# Patient Record
Sex: Female | Born: 1940 | Race: White | Hispanic: No | Marital: Married | Smoking: Never smoker
Health system: Southern US, Community
[De-identification: ages and names within clinical notes are randomized; demographics above are authoritative.]

## PROBLEM LIST (undated history)

## (undated) DIAGNOSIS — H269 Unspecified cataract: Secondary | ICD-10-CM

## (undated) DIAGNOSIS — C801 Malignant (primary) neoplasm, unspecified: Secondary | ICD-10-CM

## (undated) DIAGNOSIS — M810 Age-related osteoporosis without current pathological fracture: Secondary | ICD-10-CM

## (undated) HISTORY — DX: Age-related osteoporosis without current pathological fracture: M81.0

## (undated) HISTORY — PX: APPENDECTOMY: SHX54

## (undated) HISTORY — DX: Malignant (primary) neoplasm, unspecified: C80.1

## (undated) HISTORY — PX: BREAST SURGERY: SHX581

## (undated) HISTORY — DX: Unspecified cataract: H26.9

## (undated) HISTORY — PX: MASTECTOMY: SHX3

---

## 2021-08-02 DIAGNOSIS — R011 Cardiac murmur, unspecified: Secondary | ICD-10-CM | POA: Insufficient documentation

## 2021-08-02 DIAGNOSIS — Z85820 Personal history of malignant melanoma of skin: Secondary | ICD-10-CM | POA: Insufficient documentation

## 2021-08-02 DIAGNOSIS — Z85828 Personal history of other malignant neoplasm of skin: Secondary | ICD-10-CM | POA: Insufficient documentation

## 2021-08-26 ENCOUNTER — Ambulatory Visit
Admission: RE | Admit: 2021-08-26 | Discharge: 2021-08-26 | Disposition: A | Discharge status: Home / Self Care | Payer: Medicare Other | Source: Ambulatory Visit | Attending: Family Medicine | Admitting: Family Medicine

## 2021-08-26 ENCOUNTER — Other Ambulatory Visit: Payer: Self-pay | Admitting: Family Medicine

## 2021-08-26 ENCOUNTER — Other Ambulatory Visit: Payer: Self-pay

## 2021-08-26 DIAGNOSIS — S79912A Unspecified injury of left hip, initial encounter: Secondary | ICD-10-CM

## 2021-09-14 DIAGNOSIS — I493 Ventricular premature depolarization: Secondary | ICD-10-CM | POA: Insufficient documentation

## 2021-09-14 DIAGNOSIS — E785 Hyperlipidemia, unspecified: Secondary | ICD-10-CM | POA: Insufficient documentation

## 2021-09-30 ENCOUNTER — Other Ambulatory Visit: Payer: Self-pay | Admitting: Internal Medicine

## 2021-09-30 DIAGNOSIS — Z13820 Encounter for screening for osteoporosis: Secondary | ICD-10-CM

## 2021-10-18 ENCOUNTER — Other Ambulatory Visit (HOSPITAL_COMMUNITY): Payer: Self-pay | Admitting: General Surgery

## 2021-10-18 ENCOUNTER — Other Ambulatory Visit: Payer: Self-pay | Admitting: General Surgery

## 2021-10-18 DIAGNOSIS — Z853 Personal history of malignant neoplasm of breast: Secondary | ICD-10-CM | POA: Insufficient documentation

## 2021-10-18 DIAGNOSIS — C434 Malignant melanoma of scalp and neck: Secondary | ICD-10-CM

## 2021-10-19 NOTE — Progress Notes (Signed)
Surgical Instructions    Your procedure is scheduled on 10/28/21.  Report to [facility] Main Entrance "A" at 11:30 A.M., then check in with the Admitting office.  Call this number if you have problems the morning of surgery:  [phone]   If you have any questions prior to your surgery date call [phone]: Open Monday-Friday 8am-4pm    Remember:  Do not eat after midnight the night before your surgery  You may drink clear liquids until 10:30am the morning of your surgery.   Clear liquids allowed are: Water, Non-Citrus Juices (without pulp), Carbonated Beverages, Clear Tea, Black Coffee ONLY (NO MILK, CREAM OR POWDERED CREAMER of any kind), and Gatorade    Take these medicines the morning of surgery with A SIP OF WATER: NONE   As of today, STOP taking any Aspirin (unless otherwise instructed by your surgeon) Aleve, Naproxen, Ibuprofen, Motrin, Advil, Goody's, BC's, all herbal medications, fish oil, and all vitamins.  After your COVID test   You are not required to quarantine however you are required to wear a well-fitting mask when you are out and around people not in your household.  If your mask becomes wet or soiled, replace with a new one.  Wash your hands often with soap and water for 20 seconds or clean your hands with an alcohol-based hand sanitizer that contains at least 60% alcohol.  Do not share personal items.  Notify your provider: if you are in close contact with someone who has COVID  or if you develop a fever of 100.4 or greater, sneezing, cough, sore throat, shortness of breath or body aches.           Do not wear jewelry or makeup Do not wear lotions, powders, perfumes/colognes, or deodorant. Men may shave face and neck. Do not bring valuables to the hospital. Do not wear nail polish, gel polish, artificial nails, or any other type of covering on natural nails (fingers and toes) If you have artificial nails or gel coating that need to be removed by a nail  salon, please have this removed prior to surgery. Artificial nails or gel coating may interfere with anesthesia's ability to adequately monitor your vital signs.             [facility] is not responsible for any belongings or valuables.  Do NOT Smoke (Tobacco/Vaping)  24 hours prior to your procedure  If you use a CPAP at night, you may bring your mask for your overnight stay.   Contacts, glasses, hearing aids, dentures or partials may not be worn into surgery, please bring cases for these belongings   For patients admitted to the hospital, discharge time will be determined by your treatment team.   Patients discharged the day of surgery will not be allowed to drive home, and someone needs to stay with them for 24 hours.  NO VISITORS WILL BE ALLOWED IN PRE-OP WHERE PATIENTS ARE PREPPED FOR SURGERY.  ONLY 1 SUPPORT PERSON MAY BE PRESENT IN THE WAITING ROOM WHILE YOU ARE IN SURGERY.  IF YOU ARE TO BE ADMITTED, ONCE YOU ARE IN YOUR ROOM YOU WILL BE ALLOWED TWO (2) VISITORS. 1 (ONE) VISITOR MAY STAY OVERNIGHT BUT MUST ARRIVE TO THE ROOM BY 8pm.  Minor children may have two parents present. Special consideration for safety and communication needs will be reviewed on a case by case basis.  Special instructions:    Oral Hygiene is also important to reduce your risk of infection.  Remember -  BRUSH YOUR TEETH THE MORNING OF SURGERY WITH YOUR REGULAR TOOTHPASTE   [place]- Preparing For Surgery  Before surgery, you can play an important role. Because skin is not sterile, your skin needs to be as free of germs as possible. You can reduce the number of germs on your skin by washing with CHG (chlorahexidine gluconate) Soap before surgery.  CHG is an antiseptic cleaner which kills germs and bonds with the skin to continue killing germs even after washing.     Please do not use if you have an allergy to CHG or antibacterial soaps. If your skin becomes reddened/irritated stop using the CHG.  Do not  shave (including legs and underarms) for at least 48 hours prior to first CHG shower. It is OK to shave your face.  Please follow these instructions carefully.     Shower the NIGHT BEFORE SURGERY and the MORNING OF SURGERY with CHG Soap.   If you chose to wash your hair, wash your hair first as usual with your normal shampoo. After you shampoo, rinse your hair and body thoroughly to remove the shampoo.  Then [employer] and genitals (private parts) with your normal soap and rinse thoroughly to remove soap.  After that Use CHG Soap as you would any other liquid soap. You can apply CHG directly to the skin and wash gently with a scrungie or a clean washcloth.   Apply the CHG Soap to your body ONLY FROM THE NECK DOWN.  Do not use on open wounds or open sores. Avoid contact with your eyes, ears, mouth and genitals (private parts). Wash Face and genitals (private parts)  with your normal soap.   Wash thoroughly, paying special attention to the area where your surgery will be performed.  Thoroughly rinse your body with warm water from the neck down.  DO NOT shower/wash with your normal soap after using and rinsing off the CHG Soap.  Pat yourself dry with a CLEAN TOWEL.  Wear CLEAN PAJAMAS to bed the night before surgery  Place CLEAN SHEETS on your bed the night before your surgery  DO NOT SLEEP WITH PETS.   Day of Surgery: Take a shower with CHG soap. Wear Clean/Comfortable clothing the morning of surgery Do not apply any deodorants/lotions.   Remember to brush your teeth WITH YOUR REGULAR TOOTHPASTE.   Please read over the following fact sheets that you were given.

## 2021-10-20 ENCOUNTER — Encounter (HOSPITAL_COMMUNITY): Payer: Self-pay

## 2021-10-20 ENCOUNTER — Encounter (HOSPITAL_COMMUNITY)
Admission: RE | Admit: 2021-10-20 | Discharge: 2021-10-20 | Disposition: A | Discharge status: Home / Self Care | Payer: Medicare Other | Source: Ambulatory Visit | Attending: General Surgery | Admitting: General Surgery

## 2021-10-20 ENCOUNTER — Ambulatory Visit (HOSPITAL_COMMUNITY)
Admission: RE | Admit: 2021-10-20 | Discharge: 2021-10-20 | Disposition: A | Discharge status: Home / Self Care | Payer: Medicare Other | Source: Ambulatory Visit | Attending: General Surgery | Admitting: General Surgery

## 2021-10-20 ENCOUNTER — Other Ambulatory Visit: Payer: Self-pay

## 2021-10-20 DIAGNOSIS — C434 Malignant melanoma of scalp and neck: Secondary | ICD-10-CM | POA: Insufficient documentation

## 2021-10-20 LAB — CBC WITH DIFFERENTIAL/PLATELET
Abs Immature Granulocytes: 0.02 10*3/uL (ref 0.00–0.07)
Basophils Absolute: 0 10*3/uL (ref 0.0–0.1)
Basophils Relative: 1 %
Eosinophils Absolute: 0.1 10*3/uL (ref 0.0–0.5)
Eosinophils Relative: 1 %
HCT: 41.9 % (ref 36.0–46.0)
Hemoglobin: 14.1 g/dL (ref 12.0–15.0)
Immature Granulocytes: 0 %
Lymphocytes Relative: 25 %
Lymphs Abs: 1.6 10*3/uL (ref 0.7–4.0)
MCH: 32.7 pg (ref 26.0–34.0)
MCHC: 33.7 g/dL (ref 30.0–36.0)
MCV: 97.2 fL (ref 80.0–100.0)
Monocytes Absolute: 0.5 10*3/uL (ref 0.1–1.0)
Monocytes Relative: 8 %
Neutro Abs: 4.3 10*3/uL (ref 1.7–7.7)
Neutrophils Relative %: 65 %
Platelets: 275 10*3/uL (ref 150–400)
RBC: 4.31 MIL/uL (ref 3.87–5.11)
RDW: 12.3 % (ref 11.5–15.5)
WBC: 6.6 10*3/uL (ref 4.0–10.5)
nRBC: 0 % (ref 0.0–0.2)

## 2021-10-20 LAB — COMPREHENSIVE METABOLIC PANEL
ALT: 14 U/L (ref 0–44)
AST: 21 U/L (ref 15–41)
Albumin: 3.9 g/dL (ref 3.5–5.0)
Alkaline Phosphatase: 125 U/L (ref 38–126)
Anion gap: 8 (ref 5–15)
BUN: 8 mg/dL (ref 8–23)
CO2: 25 mmol/L (ref 22–32)
Calcium: 10 mg/dL (ref 8.9–10.3)
Chloride: 101 mmol/L (ref 98–111)
Creatinine, Ser: 0.61 mg/dL (ref 0.44–1.00)
GFR, Estimated: >60 mL/min (ref >60)
Glucose, Bld: 102 mg/dL — ABNORMAL HIGH (ref 70–99)
Potassium: 4.1 mmol/L (ref 3.5–5.1)
Sodium: 134 mmol/L — ABNORMAL LOW (ref 135–145)
Total Bilirubin: 0.7 mg/dL (ref 0.3–1.2)
Total Protein: 6.8 g/dL (ref 6.5–8.1)

## 2021-10-20 LAB — LACTATE DEHYDROGENASE: LDH: 151 U/L (ref 98–192)

## 2021-10-20 NOTE — Progress Notes (Signed)
PCP - Dr. Michae Kava, [facility] Cardiologist - none  PPM/ICD - denies Device Orders -  Rep Notified -   Chest x-ray - 10/20/21 EKG - no Stress Test - no ECHO - no Cardiac Cath - no Sleep Study - none CPAP -   Fasting Blood Sugar - n/a Checks Blood Sugar _____ times a day  Blood Thinner Instructions:n/a Aspirin Instructions:n/a  ERAS Protcol -yes,clear liquids until 1030 PRE-SURGERY Ensure or G2- no  COVID TEST- n/a;ambulatory surgery   Anesthesia review: no  Patient denies shortness of breath, fever, cough and chest pain at PAT appointment   All instructions explained to the patient, with a verbal understanding of the material. Patient agrees to go over the instructions while at home for a better understanding. Patient also instructed to self quarantine after being tested for COVID-19. The opportunity to ask questions was provided.

## 2021-10-22 ENCOUNTER — Ambulatory Visit (HOSPITAL_COMMUNITY)
Admission: RE | Admit: 2021-10-22 | Discharge: 2021-10-22 | Disposition: A | Discharge status: Home / Self Care | Payer: Medicare Other | Source: Ambulatory Visit | Attending: General Surgery | Admitting: General Surgery

## 2021-10-22 ENCOUNTER — Other Ambulatory Visit: Payer: Self-pay

## 2021-10-22 DIAGNOSIS — C434 Malignant melanoma of scalp and neck: Secondary | ICD-10-CM | POA: Insufficient documentation

## 2021-10-22 MED ORDER — TECHNETIUM TC 99M TILMANOCEPT KIT
0.5000 | PACK | Freq: Once | INTRAVENOUS | Status: AC | PRN
  Administered 2021-10-22: 0.5 via INTRADERMAL

## 2021-10-27 NOTE — H&P (Signed)
REFERRING: Ernestine Mcmurray, PA  PROVIDER: Georgianne Fick, MD  MRN: [id number] DOB: [birth year] DATE OF ENCOUNTER: 10/18/2021 Subjective   Chief Complaint: Melanoma   History of Present Illness: Christina Mcguire is a 81 y.o. 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 The patient is accompanied by her daughter. The patient is not sure how long she has had a mass on her scalp, but her daughter states it has been a while. However, it used to just be the center portion. The newer lateral darker aspect has only been present for a few months. She was seen for a routine Derm visit for a skin check and was told to have this mass on her scalp. Several biopsies were performed. One of them showed melanoma in situ, but the other one showed invasive melanoma that was 3.8 mm in thickness with positive lateral margin. There was lymphovascular invasion seen. Ulceration and regression was not seen. There was no neurotropism. The mitotic rate was 13/mm. There were nonbrisk tumor infiltrating lymphocytes.  Of note, the patient has had melanoma twice in the past. She had a 5 melanoma in 1982 and a right arm melanoma in 2006. She had left breast cancer in 1989 that was treated with a mastectomy. That was at age 63. Her daughter has also had breast cancer. Her daughter had negative BRCA testing in approximately 2014 or 15.  Path is from [place] [facility] out of [place]  Accession number [id number] Patient ID is [id number], date was 10/05/21  Review of Systems: A complete review of systems was obtained from the patient. I have reviewed this information and discussed as appropriate with the patient. See HPI as well for other ROS.  Review of Systems  All other systems reviewed and are negative.   Medical History: Past Medical History:   Diagnosis Date   History of cancer   Patient Active Problem List  Diagnosis   Malignant melanoma of scalp (CMS-HCC)   H/O malignant neoplasm of breast   History of melanoma   Past Surgical History:  Procedure Laterality Date   APPENDECTOMY   MASTECTOMY    No Known Allergies  No current outpatient medications on file prior to visit.   No current facility-administered medications on file prior to visit.   No family history on file.   Social History   Tobacco Use  Smoking Status Never  Smokeless Tobacco Never    Social History   Socioeconomic History   Marital status: Married  Tobacco Use   Smoking status: Never   Smokeless tobacco: Never  Substance and Sexual Activity   Alcohol use: Yes   Drug use: Never   Objective:   Vitals:  10/18/21 0953  BP: 130/82  Pulse: 77  Temp: 37 C (98.6 F)  SpO2: 98%  Weight: 64.3 kg (141 lb 12.8 oz)  Height: 152.4 cm (5')   Body mass index is 27.69 kg/m.  Head: Normocephalic and atraumatic.  Mouth/Throat: Oropharynx is clear and moist. No oropharyngeal exudate.  Eyes: Conjunctivae are normal. Pupils are equal, round, and reactive to light. No scleral icterus.  Neck: Normal range of motion. Neck supple. No tracheal deviation present. No thyromegaly present.  Cardiovascular: Normal rate, regular rhythm, normal heart sounds and intact distal pulses. Exam reveals no gallop and no friction rub.  No murmur heard.  Respiratory: Effort normal and breath sounds normal. No respiratory distress. No wheezes, rales or rhonchi. No chest wall tenderness.  GI: Soft. Bowel sounds are normal. Abdomen is soft, non tender, non distended. No masses or hepatosplenomegaly is present. There is no rebound and no guarding.  Musculoskeletal: . Extremities are non tender and without deformity.  Lymphadenopathy: No cervical or axillary adenopathy.  Neurological: Alert and oriented to person, place, and time. Coordination normal.  Skin: Scalp with  lesion on vertex just to the right. There is a central portion that is purplish/brown with surrounding scab and black areas. The center portion is around 8-9 mm and the total diameter is around 1.8-2 cm.  Skin is warm and dry. No rash noted. No diaphoresis. No erythema. No pallor.  Psychiatric: Normal mood and affect.Behavior is normal. Judgment and thought content normal.   Labs, Imaging and Diagnostic Testing:  Assessment and Plan:   Diagnoses and all orders for this visit:  Malignant melanoma of scalp (CMS-HCC) - NM lymphoscintigraphy  H/O malignant neoplasm of breast  History of melanoma   Patient has a new diagnosis of at least pT3a scalp melanoma. I recommend a wide local excision, dermal matrix closure, and sentinel node biopsy. She will need preoperative lymphoscintigraphy.  I discussed the procedure with the patient and her daughter. I reviewed that we would need margins and the approximate size of the defect. I discussed that these are will not grow back in this region. I reviewed the risk of numbness and bleeding. I discussed that the wound will likely take quite a while to heal.  For the sentinel node biopsy, I do recommend this given the size, lymphovascular invasion, and mitotic rate. Immunotherapy is still well-tolerated by 86 year olds, and she is relatively active with activities at [facility] [facility]   I discussed risks of sentinel node biopsy including bleeding, infection, fluid buildup, numbness, motor nerve damage, and more. I discussed that it is possible that the sentinel node could be in the parotid gland. The patient and her daughter are reluctant to have a sentinel node biopsy at a location that is risky given her age. That is reasonable. If the lymphoscintigram mapped to the parotid, we would not do a biopsy in this region.  I discussed referral to genetics given her history of 3 melanomas and breast cancer as well as her daughter's history of breast cancer. Her  daughter just had a breast cancer panel, a larger scale panel would be indicated for the patient. They are going to think about this. The daughter has significant travel plans coming up starting at the end of February. These are work-related. We will do this at the first available opportunity.    Georgianne Fick, MD

## 2021-10-28 ENCOUNTER — Other Ambulatory Visit: Payer: Self-pay

## 2021-10-28 ENCOUNTER — Ambulatory Visit (HOSPITAL_COMMUNITY): Payer: Medicare Other | Admitting: Anesthesiology

## 2021-10-28 ENCOUNTER — Other Ambulatory Visit: Payer: Self-pay | Admitting: General Surgery

## 2021-10-28 ENCOUNTER — Ambulatory Visit (HOSPITAL_BASED_OUTPATIENT_CLINIC_OR_DEPARTMENT_OTHER): Payer: Medicare Other | Admitting: Anesthesiology

## 2021-10-28 ENCOUNTER — Encounter (HOSPITAL_COMMUNITY)
Admission: RE | Disposition: A | Discharge status: Home / Self Care | Payer: Self-pay | Source: Home / Self Care | Attending: General Surgery

## 2021-10-28 ENCOUNTER — Ambulatory Visit (HOSPITAL_COMMUNITY)
Admission: RE | Admit: 2021-10-28 | Discharge: 2021-10-28 | Disposition: A | Discharge status: Home / Self Care | Payer: Medicare Other | Attending: General Surgery | Admitting: General Surgery

## 2021-10-28 ENCOUNTER — Encounter (HOSPITAL_COMMUNITY): Payer: Self-pay | Admitting: General Surgery

## 2021-10-28 DIAGNOSIS — Z853 Personal history of malignant neoplasm of breast: Secondary | ICD-10-CM | POA: Diagnosis not present

## 2021-10-28 DIAGNOSIS — C434 Malignant melanoma of scalp and neck: Secondary | ICD-10-CM | POA: Insufficient documentation

## 2021-10-28 DIAGNOSIS — Z85820 Personal history of malignant melanoma of skin: Secondary | ICD-10-CM | POA: Diagnosis not present

## 2021-10-28 HISTORY — PX: MELANOMA EXCISION WITH SENTINEL LYMPH NODE BIOPSY: SHX5267

## 2021-10-28 SURGERY — MELANOMA EXCISION WITH SENTINEL LYMPH NODE BIOPSY
Anesthesia: General | Site: Scalp

## 2021-10-28 MED ORDER — LIDOCAINE HCL (PF) 1 % IJ SOLN
INTRAMUSCULAR | Status: AC
  Filled 2021-10-28: qty 30

## 2021-10-28 MED ORDER — CHLORHEXIDINE GLUCONATE CLOTH 2 % EX PADS: 6.0000 | MEDICATED_PAD | Freq: Once | CUTANEOUS | Status: DC

## 2021-10-28 MED ORDER — CEFAZOLIN SODIUM-DEXTROSE 2-4 GM/100ML-% IV SOLN
INTRAVENOUS | Status: AC
  Filled 2021-10-28: qty 100

## 2021-10-28 MED ORDER — PHENYLEPHRINE HCL-NACL 20-0.9 MG/250ML-% IV SOLN
INTRAVENOUS | Status: DC | PRN
  Administered 2021-10-28: 50 ug/min via INTRAVENOUS

## 2021-10-28 MED ORDER — ROCURONIUM BROMIDE 10 MG/ML (PF) SYRINGE
PREFILLED_SYRINGE | INTRAVENOUS | Status: DC | PRN
Start: 2021-10-28 — End: 2021-10-28
  Administered 2021-10-28: 70 mg via INTRAVENOUS

## 2021-10-28 MED ORDER — FENTANYL CITRATE (PF) 250 MCG/5ML IJ SOLN
INTRAMUSCULAR | Status: AC
  Filled 2021-10-28: qty 5

## 2021-10-28 MED ORDER — PHENYLEPHRINE 40 MCG/ML (10ML) SYRINGE FOR IV PUSH (FOR BLOOD PRESSURE SUPPORT)
PREFILLED_SYRINGE | INTRAVENOUS | Status: DC | PRN
  Administered 2021-10-28: 40 ug via INTRAVENOUS
  Administered 2021-10-28: 80 ug via INTRAVENOUS

## 2021-10-28 MED ORDER — 0.9 % SODIUM CHLORIDE (POUR BTL) OPTIME
TOPICAL | Status: DC | PRN
Start: 2021-10-28 — End: 2021-10-28
  Administered 2021-10-28: 1000 mL

## 2021-10-28 MED ORDER — OXYCODONE HCL 5 MG/5ML PO SOLN: 5.0000 mg | Freq: Once | ORAL | Status: DC | PRN

## 2021-10-28 MED ORDER — CEFAZOLIN SODIUM-DEXTROSE 2-4 GM/100ML-% IV SOLN
2.0000 g | INTRAVENOUS | Status: AC
  Administered 2021-10-28: 25 g via INTRAVENOUS
  Administered 2021-10-28: 2 g via INTRAVENOUS

## 2021-10-28 MED ORDER — PHENYLEPHRINE HCL (PRESSORS) 10 MG/ML IV SOLN
INTRAVENOUS | Status: AC
  Filled 2021-10-28: qty 1

## 2021-10-28 MED ORDER — FENTANYL CITRATE (PF) 100 MCG/2ML IJ SOLN
INTRAMUSCULAR | Status: DC | PRN
  Administered 2021-10-28: 100 ug via INTRAVENOUS

## 2021-10-28 MED ORDER — CHLORHEXIDINE GLUCONATE 0.12 % MT SOLN
15.0000 mL | Freq: Once | OROMUCOSAL | Status: AC
  Administered 2021-10-28: 15 mL via OROMUCOSAL
  Filled 2021-10-28: qty 15

## 2021-10-28 MED ORDER — EPHEDRINE SULFATE-NACL 50-0.9 MG/10ML-% IV SOSY
PREFILLED_SYRINGE | INTRAVENOUS | Status: DC | PRN
Start: 2021-10-28 — End: 2021-10-28
  Administered 2021-10-28: 5 mg via INTRAVENOUS

## 2021-10-28 MED ORDER — OXYCODONE HCL 5 MG PO TABS: 2.5000 mg | ORAL_TABLET | Freq: Four times a day (QID) | ORAL | 0 refills | Status: DC | PRN

## 2021-10-28 MED ORDER — ONDANSETRON HCL 4 MG/2ML IJ SOLN
INTRAMUSCULAR | Status: DC | PRN
Start: 2021-10-28 — End: 2021-10-28
  Administered 2021-10-28: 4 mg via INTRAVENOUS

## 2021-10-28 MED ORDER — AMISULPRIDE (ANTIEMETIC) 5 MG/2ML IV SOLN: 10.0000 mg | Freq: Once | INTRAVENOUS | Status: DC | PRN

## 2021-10-28 MED ORDER — LACTATED RINGERS IV SOLN: INTRAVENOUS | Status: DC

## 2021-10-28 MED ORDER — ACETAMINOPHEN 500 MG PO TABS: 1000.0000 mg | ORAL_TABLET | ORAL | Status: AC

## 2021-10-28 MED ORDER — SUGAMMADEX SODIUM 200 MG/2ML IV SOLN
INTRAVENOUS | Status: DC | PRN
Start: 2021-10-28 — End: 2021-10-28
  Administered 2021-10-28: 200 mg via INTRAVENOUS

## 2021-10-28 MED ORDER — BUPIVACAINE-EPINEPHRINE (PF) 0.25% -1:200000 IJ SOLN
INTRAMUSCULAR | Status: AC
  Filled 2021-10-28: qty 30

## 2021-10-28 MED ORDER — PROMETHAZINE HCL 25 MG/ML IJ SOLN: 6.2500 mg | INTRAMUSCULAR | Status: DC | PRN

## 2021-10-28 MED ORDER — OXYCODONE HCL 5 MG PO TABS: 5.0000 mg | ORAL_TABLET | Freq: Once | ORAL | Status: DC | PRN

## 2021-10-28 MED ORDER — PROPOFOL 10 MG/ML IV BOLUS
INTRAVENOUS | Status: DC | PRN
  Administered 2021-10-28: 170 mg via INTRAVENOUS

## 2021-10-28 MED ORDER — ACETAMINOPHEN 500 MG PO TABS
ORAL_TABLET | ORAL | Status: AC
  Administered 2021-10-28: 1000 mg via ORAL
  Filled 2021-10-28: qty 2

## 2021-10-28 MED ORDER — LIDOCAINE HCL 1 % IJ SOLN
INTRAMUSCULAR | Status: DC | PRN
  Administered 2021-10-28: 6 mL via INTRAMUSCULAR

## 2021-10-28 MED ORDER — ORAL CARE MOUTH RINSE: 15.0000 mL | Freq: Once | OROMUCOSAL | Status: AC

## 2021-10-28 MED ORDER — HYDROMORPHONE HCL 1 MG/ML IJ SOLN: 0.2500 mg | INTRAMUSCULAR | Status: DC | PRN

## 2021-10-28 MED ORDER — PROPOFOL 10 MG/ML IV BOLUS
INTRAVENOUS | Status: AC
  Filled 2021-10-28: qty 20

## 2021-10-28 MED ORDER — LIDOCAINE 2% (20 MG/ML) 5 ML SYRINGE
INTRAMUSCULAR | Status: DC | PRN
  Administered 2021-10-28: 60 mg via INTRAVENOUS

## 2021-10-28 SURGICAL SUPPLY — 46 items
BLADE SURG 10 STRL SS (BLADE) ×2 IMPLANT
BNDG GAUZE ELAST 4 BULKY (GAUZE/BANDAGES/DRESSINGS) ×2 IMPLANT
CANISTER SUCT 3000ML PPV (MISCELLANEOUS) ×2 IMPLANT
COVER PROBE W GEL 5X96 (DRAPES) ×1 IMPLANT
COVER SURGICAL LIGHT HANDLE (MISCELLANEOUS) ×2 IMPLANT
DECANTER SPIKE VIAL GLASS SM (MISCELLANEOUS) ×2 IMPLANT
DRAPE LAPAROSCOPIC ABDOMINAL (DRAPES) ×1 IMPLANT
DRAPE ORTHO SPLIT 87X125 STRL (DRAPES) ×1 IMPLANT
DRSG ADAPTIC 3X8 NADH LF (GAUZE/BANDAGES/DRESSINGS) ×1 IMPLANT
DRSG PAD ABDOMINAL 8X10 ST (GAUZE/BANDAGES/DRESSINGS) ×1 IMPLANT
DRSG TEGADERM 4X4.75 (GAUZE/BANDAGES/DRESSINGS) ×4 IMPLANT
ELECT REM PT RETURN 9FT ADLT (ELECTROSURGICAL) ×2
ELECTRODE REM PT RTRN 9FT ADLT (ELECTROSURGICAL) ×1 IMPLANT
GAUZE SPONGE 2X2 8PLY STRL LF (GAUZE/BANDAGES/DRESSINGS) ×1 IMPLANT
GAUZE SPONGE 4X4 12PLY STRL (GAUZE/BANDAGES/DRESSINGS) ×1 IMPLANT
GLOVE SURG ENC MOIS LTX SZ6 (GLOVE) ×2 IMPLANT
GLOVE SURG UNDER LTX SZ6.5 (GLOVE) ×2 IMPLANT
GOWN STRL REUS W/ TWL LRG LVL3 (GOWN DISPOSABLE) ×2 IMPLANT
GOWN STRL REUS W/TWL 2XL LVL3 (GOWN DISPOSABLE) ×4 IMPLANT
GOWN STRL REUS W/TWL LRG LVL3 (GOWN DISPOSABLE) ×2
GRAFT MYRIAD 5 LAYER 7X10 (Graft) ×1 IMPLANT
KIT BASIN OR (CUSTOM PROCEDURE TRAY) ×2 IMPLANT
KIT TURNOVER KIT B (KITS) ×2 IMPLANT
MARKER SKIN DUAL TIP RULER LAB (MISCELLANEOUS) ×2 IMPLANT
NDL 18GX1X1/2 (RX/OR ONLY) (NEEDLE) ×1 IMPLANT
NDL HYPO 25GX1X1/2 BEV (NEEDLE) ×1 IMPLANT
NEEDLE 18GX1X1/2 (RX/OR ONLY) (NEEDLE) ×2 IMPLANT
NEEDLE 22X1 1/2 (OR ONLY) (NEEDLE) ×2 IMPLANT
NEEDLE HYPO 25GX1X1/2 BEV (NEEDLE) ×2 IMPLANT
NS IRRIG 1000ML POUR BTL (IV SOLUTION) ×2 IMPLANT
PACK GENERAL/GYN (CUSTOM PROCEDURE TRAY) ×2 IMPLANT
PACK UNIVERSAL I (CUSTOM PROCEDURE TRAY) ×1 IMPLANT
PAD ARMBOARD 7.5X6 YLW CONV (MISCELLANEOUS) ×5 IMPLANT
POWDER MYRIAD MORCELLS 500MG (Miscellaneous) ×1 IMPLANT
SPECIMEN JAR MEDIUM (MISCELLANEOUS) ×2 IMPLANT
SPONGE GAUZE 2X2 STER 10/PKG (GAUZE/BANDAGES/DRESSINGS) ×1
STOCKINETTE 6  STRL (DRAPES) ×2
STOCKINETTE 6 STRL (DRAPES) IMPLANT
STOCKINETTE IMPERVIOUS 9X36 MD (GAUZE/BANDAGES/DRESSINGS) ×2 IMPLANT
STRIP CLOSURE SKIN 1/2X4 (GAUZE/BANDAGES/DRESSINGS) ×2 IMPLANT
SUT MNCRL AB 4-0 PS2 18 (SUTURE) ×3 IMPLANT
SUT SILK 2 0 PERMA HAND 18 BK (SUTURE) ×2 IMPLANT
SUT VIC AB 3-0 SH 8-18 (SUTURE) ×2 IMPLANT
SYR CONTROL 10ML LL (SYRINGE) ×4 IMPLANT
TOWEL GREEN STERILE (TOWEL DISPOSABLE) ×2 IMPLANT
TOWEL GREEN STERILE FF (TOWEL DISPOSABLE) ×2 IMPLANT

## 2021-10-28 NOTE — Anesthesia Procedure Notes (Signed)
Procedure Name: Intubation Date/Time: 10/28/2021 3:04 PM Performed by: Georgia Duff, CRNA Pre-anesthesia Checklist: Patient identified, Emergency Drugs available, Suction available and Patient being monitored Patient Re-evaluated:Patient Re-evaluated prior to induction Oxygen Delivery Method: Circle System Utilized Preoxygenation: Pre-oxygenation with 100% oxygen Induction Type: IV induction Ventilation: Mask ventilation without difficulty Laryngoscope Size: Mac and 3 Grade View: Grade I Tube type: Oral Tube size: 7.0 mm Number of attempts: 1 Airway Equipment and Method: Stylet and Oral airway Placement Confirmation: ETT inserted through vocal cords under direct vision, positive ETCO2 and breath sounds checked- equal and bilateral Secured at: 21 cm Tube secured with: Tape Dental Injury: Teeth and Oropharynx as per pre-operative assessment

## 2021-10-28 NOTE — Transfer of Care (Signed)
Immediate Anesthesia Transfer of Care Note  Patient: Christina Mcguire  Procedure(s) Performed: WIDE LOCAL EXCISION SCALP MELANOMA WITH SENTINEL LYMPH NODE BIOPSY, COVERAGE WITH DERMAL MATRIX (Scalp)  Patient Location: PACU  Anesthesia Type:General  Level of Consciousness: awake and alert   Airway & Oxygen Therapy: Patient Spontanous Breathing  Post-op Assessment: Report given to RN and Post -op Vital signs reviewed and stable  Post vital signs: Reviewed and stable  Last Vitals:  Vitals Value Taken Time  BP 144/68 10/28/21 1628  Temp 36.5 C 10/28/21 1628  Pulse 61 10/28/21 1635  Resp 12 10/28/21 1635  SpO2 98 % 10/28/21 1635  Vitals shown include unvalidated device data.  Last Pain:  Vitals:   10/28/21 1628  TempSrc:   PainSc: 0-No pain      Patients Stated Pain Goal: 0 (11/00/34 9611)  Complications: No notable events documented.

## 2021-10-28 NOTE — Op Note (Signed)
PRE-OPERATIVE DIAGNOSIS: at least pT3a cN0 scalp melanoma   POST-OPERATIVE DIAGNOSIS:  Same  PROCEDURE:  Procedure(s): Wide local excision 1 cm margins, dermal matrix coverage for defect 3.4 cm x 4.5 cm,   SURGEON:  Surgeon(s): Stark Klein, MD  ASSIST:  Ewell Poe, RNFA  ANESTHESIA:   local and general  DRAINS: none   LOCAL MEDICATIONS USED:  MARCAINE    and XYLOCAINE   SPECIMEN:  Source of Specimen:  wide local excision scalp melanoma  FINDINGS:  margins grossly negative.    DISPOSITION OF SPECIMEN:  PATHOLOGY  COUNTS:  YES  PLAN OF CARE: Discharge to home after PACU  PATIENT DISPOSITION:  PACU - hemodynamically stable.    PROCEDURE:   Pt was identified in the holding area, taken to the OR, and placed supine on the OR table.  General anesthesia was induced.  Time out was performed according to the surgical safety checklist.  When all was correct, we continued.  The patient was placed into the head elevated position with the head rotated to the left.  The scalp and posterior neck were prepped and draped in sterile fashion.  .    The melanoma was identified and 1 cm margins were marked out.  Local was administered under the melanoma and the adjacent tissue.  A #10 blade was used to incise the skin around the melanoma.  The cautery was used to take the dissection down to the fascia.  The skin was marked in situ with orientation sutures.  The cautery was used to take the specimen off the fascia, and it was passed off the table.    The myriad matrix was selected.  This was secured with interrupted vicryl sutures.  Myriad morcell powder was placed underneath.  This was covered with adaptic.  The adaptic was also secured with separate interrupted sutures outside the circumference of the incision and dermal matrix graft.    The melanoma site was cleaned, dried, and dressed with surgilube, gauze, ABD, and stockingette.   Needle, sponge, and instrument counts were correct.  The  patient was awakened from anesthesia and taken to the PACU in stable condition.

## 2021-10-28 NOTE — Interval H&P Note (Signed)
History and Physical Interval Note:  10/28/2021 2:08 PM  Christina Mcguire  has presented today for surgery, with the diagnosis of MALIGNANT MELANOMA OF SCALP.  The various methods of treatment have been discussed with the patient and family. After consideration of risks, benefits and other options for treatment, the patient has consented to  Procedure(s): WIDE LOCAL EXCISION SCALP MELANOMA WITH SENTINEL LYMPH NODE BIOPSY, COVERAGE WITH DERMAL MATRIX (N/A) as a surgical intervention.  The patient's history has been reviewed, patient examined, no change in status, stable for surgery.  I have reviewed the patient's chart and labs.  Questions were answered to the patient's satisfaction.     Stark Klein

## 2021-10-28 NOTE — Anesthesia Preprocedure Evaluation (Signed)
Anesthesia Evaluation  Patient identified by MRN, date of birth, ID band Patient awake    Reviewed: Allergy & Precautions, NPO status , Patient's Chart, lab work & pertinent test results  Airway Mallampati: II  TM Distance: >3 FB Neck ROM: Full    Dental no notable dental hx.    Pulmonary neg pulmonary ROS,    Pulmonary exam normal breath sounds clear to auscultation       Cardiovascular negative cardio ROS Normal cardiovascular exam Rhythm:Regular Rate:Normal     Neuro/Psych negative neurological ROS  negative psych ROS   GI/Hepatic negative GI ROS, Neg liver ROS,   Endo/Other  negative endocrine ROS  Renal/GU negative Renal ROS  negative genitourinary   Musculoskeletal negative musculoskeletal ROS (+)   Abdominal   Peds negative pediatric ROS (+)  Hematology negative hematology ROS (+)   Anesthesia Other Findings Malignant Melanoma  Reproductive/Obstetrics negative OB ROS                             Anesthesia Physical Anesthesia Plan  ASA: 3  Anesthesia Plan: General   Post-op Pain Management:    Induction: Intravenous  PONV Risk Score and Plan: 3 and Ondansetron, Dexamethasone, Midazolam and Treatment may vary due to age or medical condition  Airway Management Planned: Oral ETT  Additional Equipment:   Intra-op Plan:   Post-operative Plan: Extubation in OR  Informed Consent: I have reviewed the patients History and Physical, chart, labs and discussed the procedure including the risks, benefits and alternatives for the proposed anesthesia with the patient or authorized representative who has indicated his/her understanding and acceptance.     Dental advisory given  Plan Discussed with: CRNA  Anesthesia Plan Comments:         Anesthesia Quick Evaluation

## 2021-10-28 NOTE — Discharge Instructions (Addendum)
Change dressing once daily.  Remove the gauze portion and apply surgilube.  Recover with gauze.  Keep moist with the surgilube.  Do not shower without shower cap until cleared by me.      [place] Office Phone Number [phone]   POST OP INSTRUCTIONS  Always review your discharge instruction sheet given to you by the facility where your surgery was performed.  IF YOU HAVE DISABILITY OR FAMILY LEAVE FORMS, YOU MUST BRING THEM TO THE OFFICE FOR PROCESSING.  DO NOT GIVE THEM TO YOUR DOCTOR.  A prescription for pain medication may be given to you upon discharge.  Take your pain medication as prescribed, if needed.  If narcotic pain medicine is not needed, then you may take acetaminophen (Tylenol) or ibuprofen (Advil) as needed. Take your usually prescribed medications unless otherwise directed If you need a refill on your pain medication, please contact your pharmacy.  They will contact our office to request authorization.  Prescriptions will not be filled after 5pm or on week-ends. You should eat very light the first 24 hours after surgery, such as soup, crackers, pudding, etc.  Resume your normal diet the day after surgery It is common to experience some constipation if taking pain medication after surgery.  Increasing fluid intake and taking a stool softener will usually help or prevent this problem from occurring.  A mild laxative (Milk of Magnesia or Miralax) should be taken according to package directions if there are no bowel movements after 48 hours. You may shower in 48 hours with shower cap.  The surgical glue will flake off in 2-3 weeks.   ACTIVITIES:  No strenuous activity or heavy lifting for 1 week.   You may drive when you no longer are taking prescription pain medication, you can comfortably wear a seatbelt, and you can safely maneuver your car and apply brakes. RETURN TO WORK:  __________n/a_______________ Dennis Bast should see your doctor in the office for a  follow-up appointment approximately three-four weeks after your surgery.    WHEN TO CALL YOUR DOCTOR: Fever over 101.0 Nausea and/or vomiting. Extreme swelling or bruising. Continued bleeding from incision. Increased pain, redness, or drainage from the incision.  The clinic staff is available to answer your questions during regular business hours.  Please dont hesitate to call and ask to speak to one of the nurses for clinical concerns.  If you have a medical emergency, go to the nearest emergency room or call 911.  A surgeon from [facility] is always on call at the hospital.  For further questions, please visit [email or web address]

## 2021-10-28 NOTE — Anesthesia Postprocedure Evaluation (Signed)
Anesthesia Post Note  Patient: Christina Mcguire  Procedure(s) Performed: WIDE LOCAL EXCISION SCALP MELANOMA COVERAGE WITH DERMAL MATRIX (Scalp)     Patient location during evaluation: PACU Anesthesia Type: General Level of consciousness: awake and alert, oriented and patient cooperative Pain management: pain level controlled Vital Signs Assessment: post-procedure vital signs reviewed and stable Respiratory status: spontaneous breathing, nonlabored ventilation and respiratory function stable Cardiovascular status: blood pressure returned to baseline and stable Postop Assessment: no apparent nausea or vomiting Anesthetic complications: no   No notable events documented.  Last Vitals:  Vitals:   10/28/21 1643 10/28/21 1658  BP: 139/71 (!) 145/61  Pulse: (!) 56 (!) 56  Resp: 12 14  Temp:  36.6 C  SpO2: 98% 99%    Last Pain:  Vitals:   10/28/21 1658  TempSrc:   PainSc: 0-No pain                 Pervis Hocking

## 2021-10-29 ENCOUNTER — Encounter (HOSPITAL_COMMUNITY): Payer: Self-pay | Admitting: General Surgery

## 2021-11-03 LAB — SURGICAL PATHOLOGY

## 2021-11-18 ENCOUNTER — Other Ambulatory Visit: Payer: Self-pay | Admitting: General Surgery

## 2021-11-18 ENCOUNTER — Other Ambulatory Visit (HOSPITAL_COMMUNITY): Payer: Self-pay | Admitting: General Surgery

## 2021-11-18 DIAGNOSIS — C434 Malignant melanoma of scalp and neck: Secondary | ICD-10-CM

## 2021-12-15 ENCOUNTER — Encounter (HOSPITAL_COMMUNITY)
Admission: RE | Admit: 2021-12-15 | Discharge: 2021-12-15 | Disposition: A | Discharge status: Home / Self Care | Payer: Medicare Other | Source: Ambulatory Visit | Attending: General Surgery | Admitting: General Surgery

## 2021-12-15 ENCOUNTER — Other Ambulatory Visit: Payer: Self-pay

## 2021-12-15 DIAGNOSIS — M170 Bilateral primary osteoarthritis of knee: Secondary | ICD-10-CM | POA: Insufficient documentation

## 2021-12-15 DIAGNOSIS — Z9012 Acquired absence of left breast and nipple: Secondary | ICD-10-CM | POA: Diagnosis not present

## 2021-12-15 DIAGNOSIS — I2510 Atherosclerotic heart disease of native coronary artery without angina pectoris: Secondary | ICD-10-CM | POA: Diagnosis not present

## 2021-12-15 DIAGNOSIS — K4020 Bilateral inguinal hernia, without obstruction or gangrene, not specified as recurrent: Secondary | ICD-10-CM | POA: Diagnosis not present

## 2021-12-15 DIAGNOSIS — D3502 Benign neoplasm of left adrenal gland: Secondary | ICD-10-CM | POA: Diagnosis not present

## 2021-12-15 DIAGNOSIS — K429 Umbilical hernia without obstruction or gangrene: Secondary | ICD-10-CM | POA: Diagnosis not present

## 2021-12-15 DIAGNOSIS — I700 Atherosclerosis of aorta: Secondary | ICD-10-CM | POA: Diagnosis not present

## 2021-12-15 DIAGNOSIS — C434 Malignant melanoma of scalp and neck: Secondary | ICD-10-CM | POA: Diagnosis not present

## 2021-12-15 LAB — GLUCOSE, CAPILLARY: Glucose-Capillary: 114 mg/dL — ABNORMAL HIGH (ref 70–99)

## 2021-12-15 MED ORDER — FLUDEOXYGLUCOSE F - 18 (FDG) INJECTION
6.9400 | Freq: Once | INTRAVENOUS | Status: AC
  Administered 2021-12-15: 6.94 via INTRAVENOUS

## 2022-01-07 ENCOUNTER — Other Ambulatory Visit: Payer: Self-pay | Admitting: General Surgery

## 2022-01-07 DIAGNOSIS — C434 Malignant melanoma of scalp and neck: Secondary | ICD-10-CM

## 2022-01-12 ENCOUNTER — Other Ambulatory Visit: Payer: Medicare Other

## 2022-01-14 ENCOUNTER — Ambulatory Visit
Admission: RE | Admit: 2022-01-14 | Discharge: 2022-01-14 | Disposition: A | Discharge status: Home / Self Care | Payer: Medicare Other | Source: Ambulatory Visit | Attending: General Surgery | Admitting: General Surgery

## 2022-01-14 DIAGNOSIS — C434 Malignant melanoma of scalp and neck: Secondary | ICD-10-CM

## 2022-02-17 ENCOUNTER — Ambulatory Visit
Admission: RE | Admit: 2022-02-17 | Discharge: 2022-02-17 | Disposition: A | Discharge status: Home / Self Care | Payer: Medicare Other | Source: Ambulatory Visit | Attending: Internal Medicine | Admitting: Internal Medicine

## 2022-02-17 DIAGNOSIS — Z13820 Encounter for screening for osteoporosis: Secondary | ICD-10-CM

## 2022-02-24 DIAGNOSIS — M810 Age-related osteoporosis without current pathological fracture: Secondary | ICD-10-CM | POA: Insufficient documentation

## 2022-03-04 MED ORDER — DEXMEDETOMIDINE (PRECEDEX) IN NS 20 MCG/5ML (4 MCG/ML) IV SYRINGE
PREFILLED_SYRINGE | INTRAVENOUS | Status: DC | PRN
  Administered 2021-10-28: 25 ug via INTRAVENOUS

## 2022-03-04 NOTE — Addendum Note (Signed)
Addendum  created 03/04/22 0837 by Josephine Igo, CRNA   Intraprocedure Meds edited

## 2022-04-14 DIAGNOSIS — I872 Venous insufficiency (chronic) (peripheral): Secondary | ICD-10-CM | POA: Insufficient documentation

## 2022-04-14 DIAGNOSIS — D8989 Other specified disorders involving the immune mechanism, not elsewhere classified: Secondary | ICD-10-CM | POA: Insufficient documentation

## 2022-04-14 DIAGNOSIS — D490 Neoplasm of unspecified behavior of digestive system: Secondary | ICD-10-CM | POA: Insufficient documentation

## 2022-04-15 ENCOUNTER — Other Ambulatory Visit: Payer: Self-pay | Admitting: General Surgery

## 2022-04-15 DIAGNOSIS — K118 Other diseases of salivary glands: Secondary | ICD-10-CM

## 2022-04-18 ENCOUNTER — Ambulatory Visit
Admission: RE | Admit: 2022-04-18 | Discharge: 2022-04-18 | Disposition: A | Discharge status: Home / Self Care | Payer: Medicare Other | Source: Ambulatory Visit | Attending: General Surgery | Admitting: General Surgery

## 2022-04-18 DIAGNOSIS — K118 Other diseases of salivary glands: Secondary | ICD-10-CM

## 2022-05-25 DIAGNOSIS — J3489 Other specified disorders of nose and nasal sinuses: Secondary | ICD-10-CM | POA: Insufficient documentation

## 2022-05-26 ENCOUNTER — Other Ambulatory Visit (HOSPITAL_COMMUNITY): Payer: Self-pay | Admitting: Otolaryngology

## 2022-05-26 ENCOUNTER — Other Ambulatory Visit: Payer: Self-pay | Admitting: Otolaryngology

## 2022-05-26 DIAGNOSIS — K118 Other diseases of salivary glands: Secondary | ICD-10-CM

## 2022-05-26 NOTE — Progress Notes (Unsigned)
Christina Mariscal, MD  Donita Brooks D OK for US guided right parotid nodule biopsy.   [place] - 04/18/22 - image 9.   Sedation per pt request.   Cathren Harsh

## 2022-05-29 ENCOUNTER — Other Ambulatory Visit: Payer: Self-pay | Admitting: Student

## 2022-05-29 ENCOUNTER — Other Ambulatory Visit (HOSPITAL_COMMUNITY): Payer: Self-pay | Admitting: Student

## 2022-05-30 ENCOUNTER — Other Ambulatory Visit: Payer: Self-pay

## 2022-05-30 ENCOUNTER — Ambulatory Visit (HOSPITAL_COMMUNITY)
Admission: RE | Admit: 2022-05-30 | Discharge: 2022-05-30 | Disposition: A | Discharge status: Home / Self Care | Payer: Medicare Other | Source: Ambulatory Visit | Attending: Otolaryngology | Admitting: Otolaryngology

## 2022-05-30 DIAGNOSIS — K118 Other diseases of salivary glands: Secondary | ICD-10-CM

## 2022-05-30 DIAGNOSIS — D110 Benign neoplasm of parotid gland: Secondary | ICD-10-CM | POA: Insufficient documentation

## 2022-05-30 DIAGNOSIS — Z85820 Personal history of malignant melanoma of skin: Secondary | ICD-10-CM | POA: Diagnosis not present

## 2022-05-30 MED ORDER — SODIUM CHLORIDE 0.9 % IV SOLN: INTRAVENOUS | Status: DC

## 2022-05-30 MED ORDER — LIDOCAINE HCL (PF) 1 % IJ SOLN
INTRAMUSCULAR | Status: AC
  Filled 2022-05-30: qty 30

## 2022-05-30 NOTE — H&P (Signed)
Vascular and Interventional Radiology  PRE PROCEDURE H&P  Assessment  Plan:   Ms. Christina Mcguire is a 81 y.o. year old female who will undergo RIGHT PAROTID MASS BIOPSY in Interventional Radiology.  The procedure has been fully reviewed with the patient/patient's authorized representative. The risks, benefits and alternatives have been explained, and the patient/patient's authorized representative has consented to the procedure.  HPI: Ms. Christina Mcguire is a 81 y.o. year old female w Hx of melanoma, with hypermetabolic R parotid mass. This was reviewed and recommended for biopsy.  Imaging independently reviewed, demonstrating a R parotid mass.    Informed consent was obtained, witnessed and placed in the patient's chart.  Allergies: No Known Allergies  Medications:  Current Outpatient Medications on File Prior to Encounter  Medication Sig Dispense Refill   alendronate (FOSAMAX) 70 MG tablet Take 70 mg by mouth every Sunday.     Apoaequorin (PREVAGEN) 10 MG CAPS Take 10 mg by mouth daily.     Multiple Vitamins-Minerals (ADULT GUMMY PO) Take 2 capsules by mouth daily.     Tolnaftate (ANTIFUNGAL EX) Apply 1 Application topically 2 (two) times daily.     vitamin C (ASCORBIC ACID) 500 MG tablet Take 500 mg by mouth daily.     VITAMIN D PO Take 1 capsule by mouth daily.     No current facility-administered medications on file prior to encounter.    PSH:  Past Surgical History:  Procedure Laterality Date   APPENDECTOMY     MASTECTOMY     MELANOMA EXCISION WITH SENTINEL LYMPH NODE BIOPSY N/A 10/28/2021   Procedure: WIDE LOCAL EXCISION SCALP MELANOMA COVERAGE WITH DERMAL MATRIX;  Surgeon: Stark Klein, MD;  Location: [place];  Service: General;  Laterality: N/A;    PMH: No past medical history on file.  Brief Physical Examination: Vitals:   05/30/22 0800  BP: (!) 162/78  Pulse: 63  Resp: 16  SpO2: 97%   General: WD, WN female in NAD HEENT: Normocephalic, atraumatic Lungs:  Respirations non-labored  ASA Grade: 3 Mallampati Class: 3   Michaelle Birks, MD [facility]iology [facility] Radiology   Pager. [place]

## 2022-05-30 NOTE — Procedures (Signed)
Vascular and Interventional Radiology Procedure Note  Patient: Christina Mcguire DOB: 03-Aug-1941 Medical Record Number: [id number] Note Date/Time: 05/30/22 9:56 AM   Performing Physician: Michaelle Birks, MD Assistant(s): None  Diagnosis: Hx of melanoma, with hypermetabolic R parotid mass.  Procedure: RIGHT PAROTID MASS BIOPSY  Anesthesia: Local Anesthetic Complications: None Estimated Blood Loss: Minimal Specimens: Sent for Pathology  Findings:  Successful Ultrasound-guided biopsy of R parotid mass. A total of 3 samples were obtained. Hemostasis of the tract was achieved using Manual Pressure.  Plan: Bed rest for 1 hours.  See detailed procedure note with images in PACS. The patient tolerated the procedure well without incident or complication and was returned to Recovery in stable condition.    Michaelle Birks, MD Vascular and Interventional Radiology Specialists [facility] Radiology   Pager. [place]

## 2022-05-30 NOTE — Progress Notes (Signed)
Spoke with patient at bedside. Pt was advised that Dr. Maryelizabeth Kaufmann only uses local anesthesia for superficial biopsies. Pt in agreement to proceed with local only. RN at bedside and aware that pt does not need IV or labs since sedation is not needed.     Narda Rutherford, AGNP-BC 05/30/2022, 8:42 AM

## 2022-05-31 LAB — SURGICAL PATHOLOGY

## 2022-06-17 ENCOUNTER — Other Ambulatory Visit: Payer: Self-pay | Admitting: Geriatric Medicine

## 2022-06-17 DIAGNOSIS — N649 Disorder of breast, unspecified: Secondary | ICD-10-CM

## 2022-06-24 ENCOUNTER — Ambulatory Visit: Admission: RE | Admit: 2022-06-24 | Payer: Medicare Other | Source: Ambulatory Visit

## 2022-06-24 ENCOUNTER — Ambulatory Visit
Admission: RE | Admit: 2022-06-24 | Discharge: 2022-06-24 | Disposition: A | Discharge status: Home / Self Care | Payer: Medicare Other | Source: Ambulatory Visit | Attending: Geriatric Medicine | Admitting: Geriatric Medicine

## 2022-06-24 ENCOUNTER — Other Ambulatory Visit: Payer: Self-pay | Admitting: Geriatric Medicine

## 2022-06-24 DIAGNOSIS — N649 Disorder of breast, unspecified: Secondary | ICD-10-CM

## 2022-06-24 DIAGNOSIS — R921 Mammographic calcification found on diagnostic imaging of breast: Secondary | ICD-10-CM

## 2023-05-21 IMAGING — US US SOFT TISSUE HEAD/NECK
1 series · 14 of 24 positions shown · non-contrast
Comparison: None.

CLINICAL DATA: Malignant melanoma of the scalp

7 mm PET positive right parotid nodule
EXAM:
ULTRASOUND OF HEAD/NECK SOFT TISSUES
TECHNIQUE: Ultrasound examination of the head and neck soft tissues was
performed in the area of clinical concern.

[Series 1: us soft tissue head/neck · 0.05mm/px · 24 acquisitions, 14 frames shown]
[im 1/24]
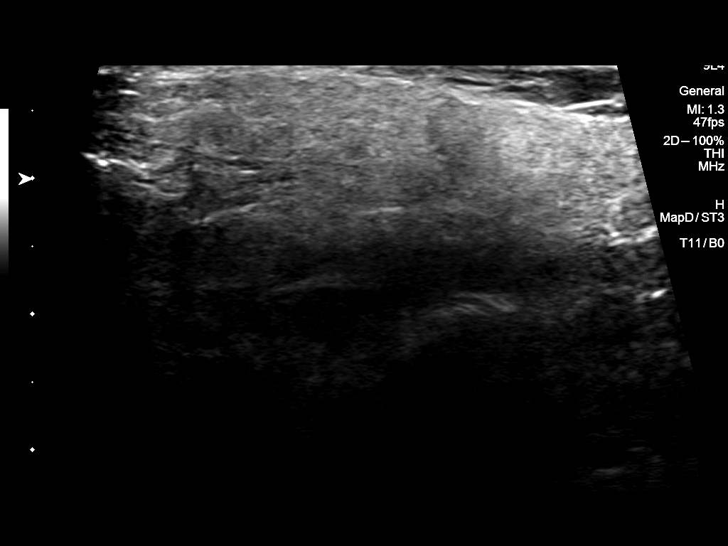
[im 3/24]
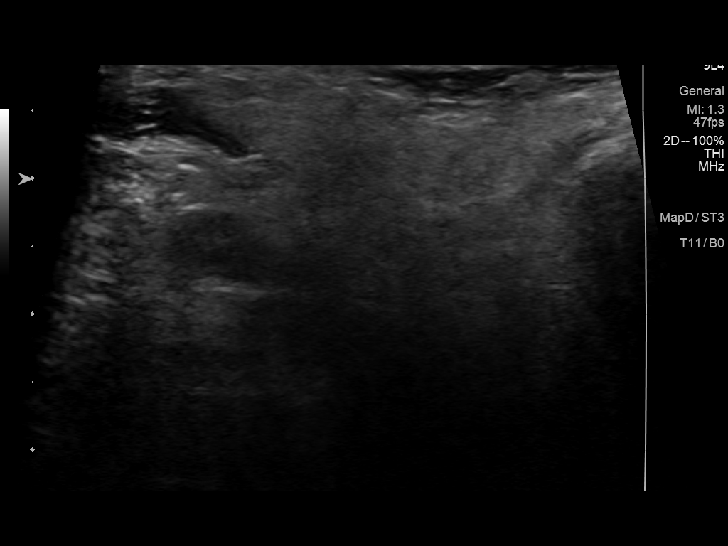
[im 5/24]
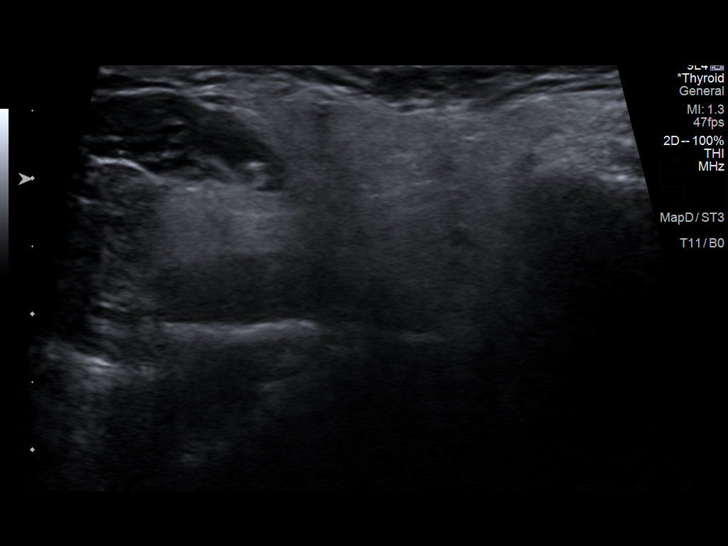
[im 7/24]
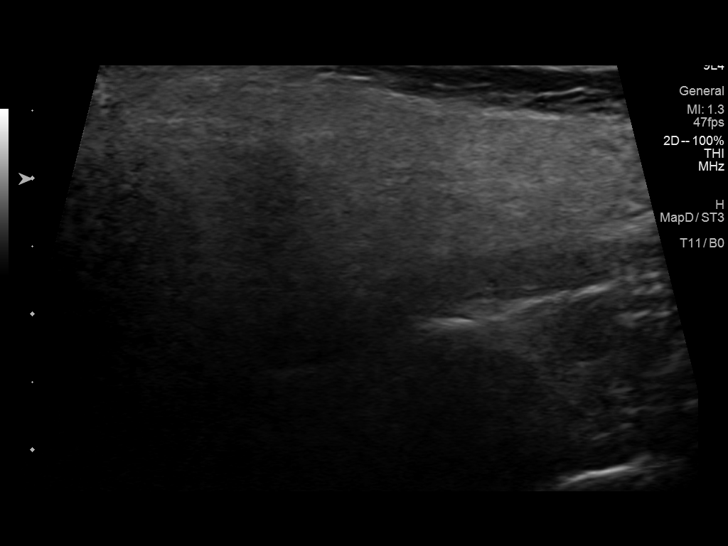
[im 8/24]
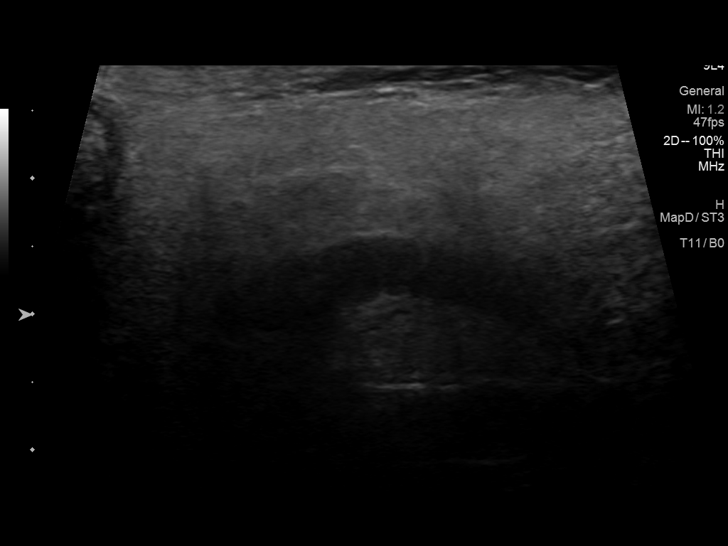
[im 10/24]
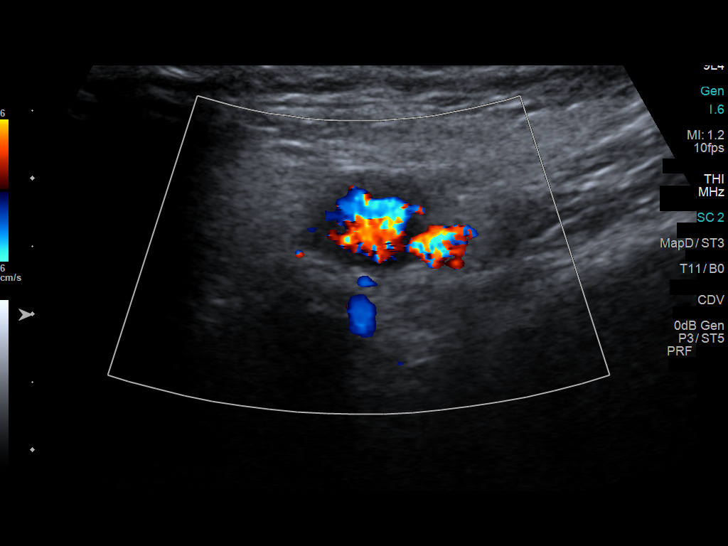
[im 12/24]
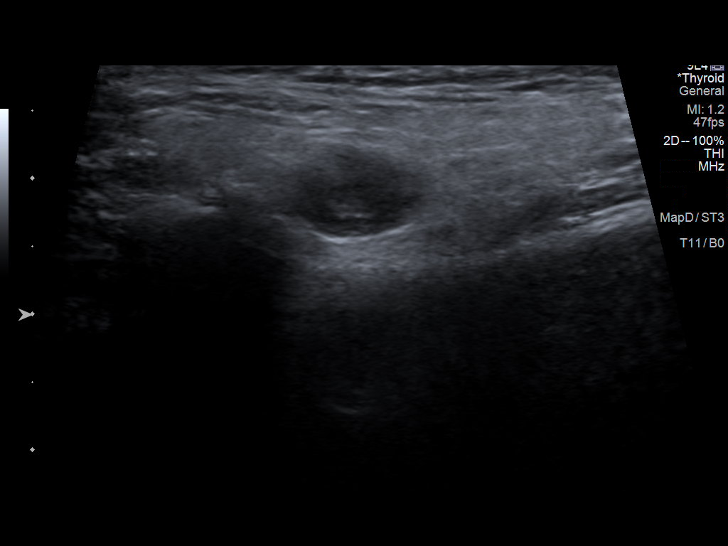
[im 13/24]
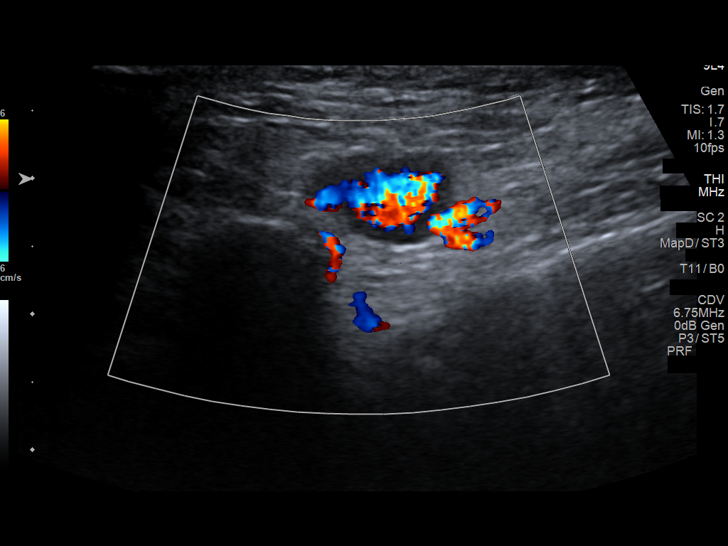
[im 15/24]
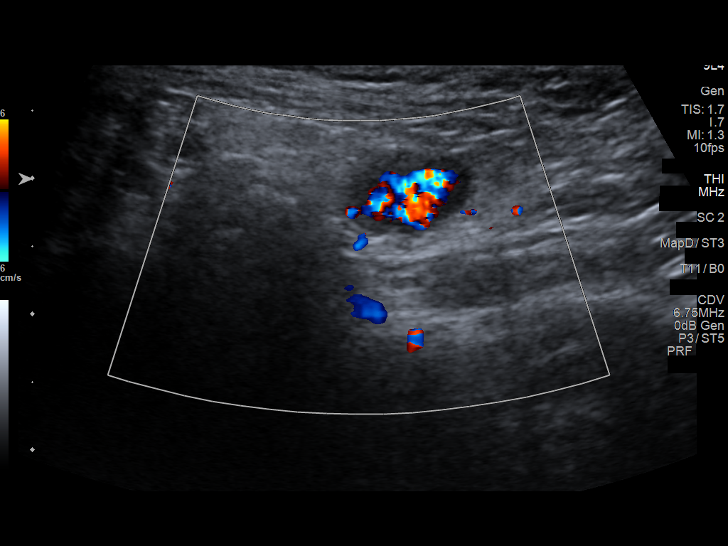
[im 17/24]
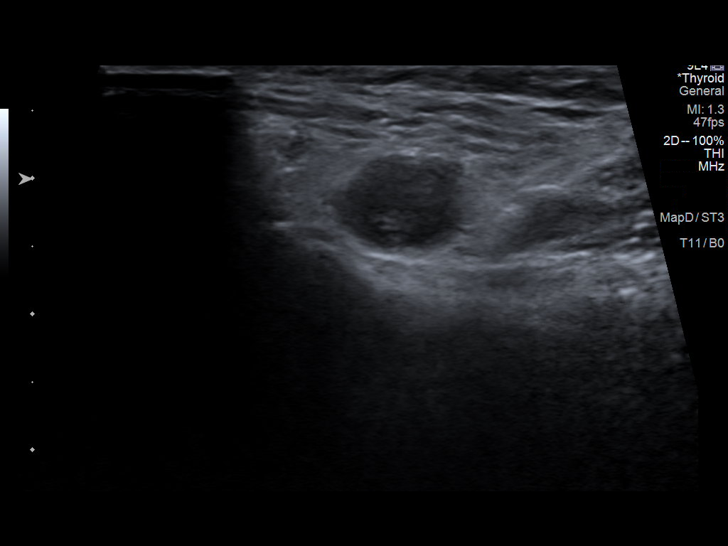
[im 19/24]
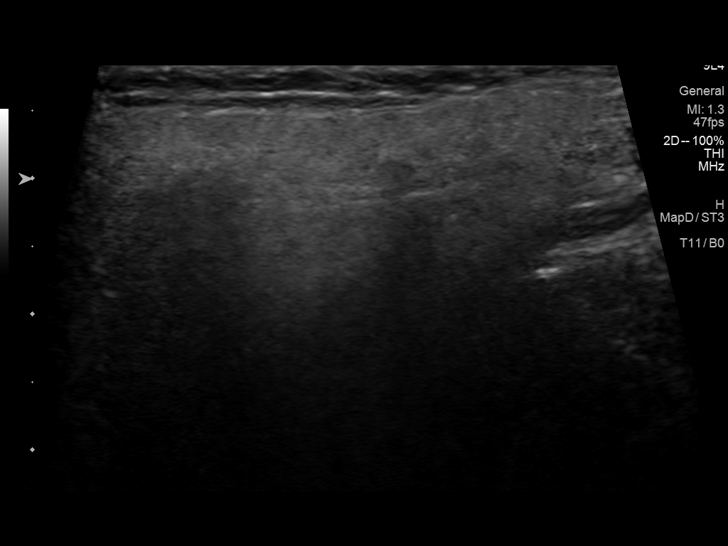
[im 20/24]
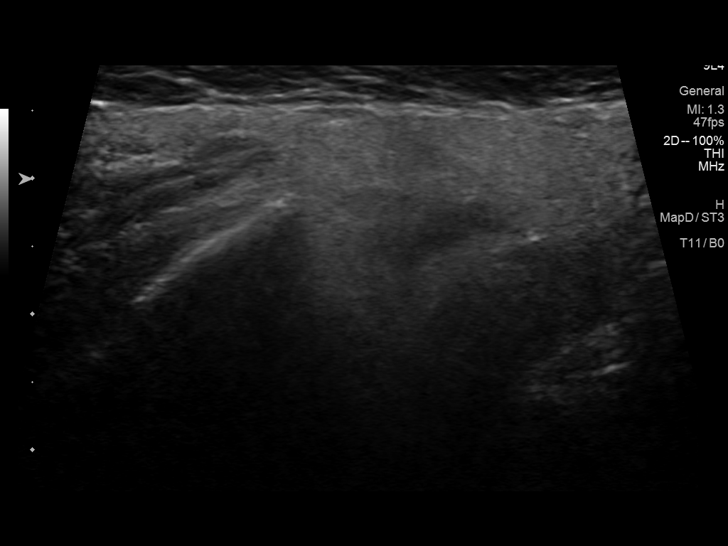
[im 22/24]
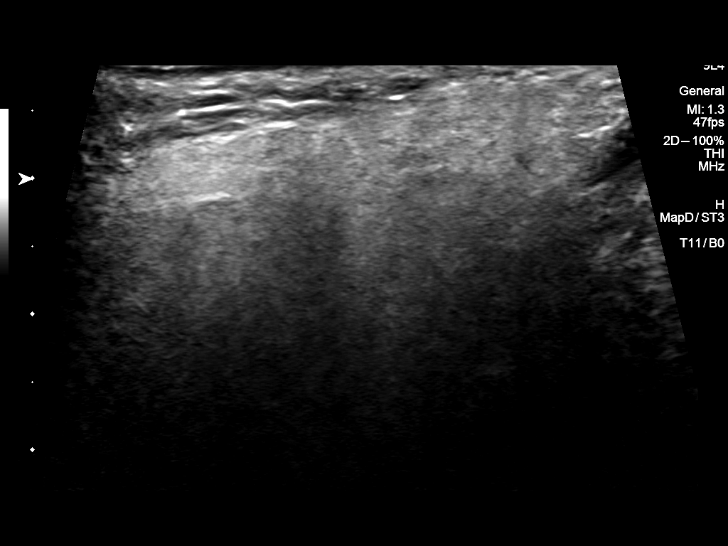
[im 24/24]
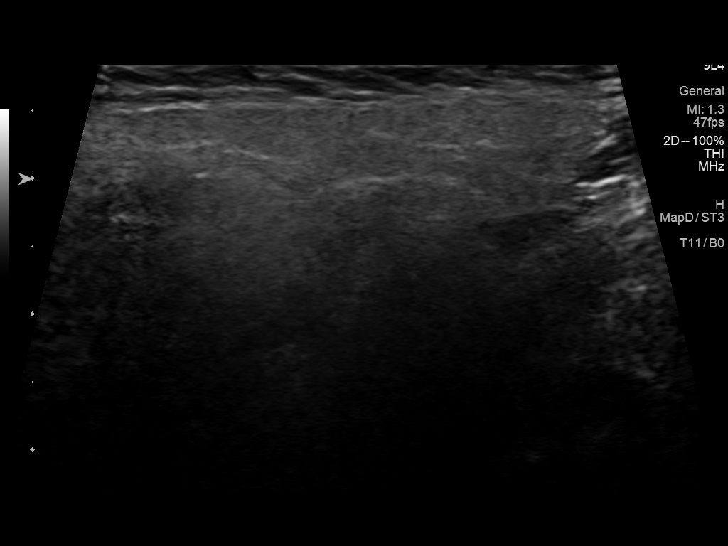

[14 of 24 positions shown; findings below may reference images not displayed]

FINDINGS: Targeted sonographic evaluation of the area of concern in the right
face demonstrates an 8 mm hypoechoic mass within the superficial
lobe of the right parotid gland which corresponds to the abnormality
seen on recent PET.
IMPRESSION: 8 mm hypoechoic mass corresponds to the FDG avid lesion seen in the
right superficial parotid lobe on recent exam. This remains
suspicious for primary parotid malignancy versus metastatic disease.

## 2023-12-29 ENCOUNTER — Encounter: Payer: Self-pay | Admitting: Sports Medicine

## 2023-12-29 NOTE — Telephone Encounter (Signed)
 Message routed to [facility] staff.

## 2024-01-01 NOTE — Telephone Encounter (Signed)
 Reached out to daughter and advised we can request at the time of the appointment.

## 2024-01-24 ENCOUNTER — Ambulatory Visit (INDEPENDENT_AMBULATORY_CARE_PROVIDER_SITE_OTHER): Admitting: Sports Medicine

## 2024-01-24 ENCOUNTER — Encounter: Payer: Self-pay | Admitting: Sports Medicine

## 2024-01-24 VITALS — BP 136/86 | HR 67 | Temp 97.4°F | Resp 16 | Ht 60.0 in | Wt 134.0 lb

## 2024-01-24 DIAGNOSIS — F0390 Unspecified dementia without behavioral disturbance: Secondary | ICD-10-CM

## 2024-01-24 DIAGNOSIS — E785 Hyperlipidemia, unspecified: Secondary | ICD-10-CM

## 2024-01-24 DIAGNOSIS — C434 Malignant melanoma of scalp and neck: Secondary | ICD-10-CM

## 2024-01-24 DIAGNOSIS — M810 Age-related osteoporosis without current pathological fracture: Secondary | ICD-10-CM

## 2024-01-24 DIAGNOSIS — I872 Venous insufficiency (chronic) (peripheral): Secondary | ICD-10-CM

## 2024-01-24 NOTE — Progress Notes (Signed)
 Careteam: Patient Care Team: Christina Arab, MD as PCP - General  PLACE OF SERVICE:  [facility]IN[facility]  Advanced Directive information Does Patient Have a Medical Advance Directive?: Yes, Type of Advance Directive: Healthcare Power of Attorney, Does patient want to make changes to medical advance directive?: No - Patient declined  No Known Allergies  Chief Complaint  Patient presents with   Establish Care    New patient     Discussed the use of AI scribe software for clinical note transcription with the patient, who gave verbal consent to proceed.  History of Present Illness    Christina Mcguire is an 83 year old female who presents for establishment of care due to her previous primary care office closing. She is accompanied by her daughter, who assists with her care.  She lives in [place] with her husband and is able to perform most daily activities independently, although she does not cook as she eats at the diner. She participates in activities such as a book circle and Tai Chi.  She has osteoporosis and has been on Alendronate (Fosamax) for two years following a bone density test in July 2023 that showed a T-score of -3.8. She is due for another bone density test. No joint pain or history of falls in the past year. She walks around her apartment but does not use the gym available in her building.  She has been experiencing memory issues since 2021, which have reportedly worsened. She needs to write things down to remember them and sometimes repeats questions. Her daughter notes that she requires reminders for daily activities like changing clothes and showering. She has not been lost and participates in activities such as a book circle and Tai Chi. She takes the supplement Prevagen for memory but has not been on prescription memory medications.  Her past medical history includes a mastectomy for breast cancer in 1984 and a melanoma of the scalp, for which she sees a dermatologist  annually. She had a nasal mass that was biopsied and found to be benign. She wears a Depend for urinary incontinence but denies any burning or blood in her urine. She reports regular bowel movements but occasional constipation.   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 No further treatment or work-up is needed.            Review of Systems:  Review of Systems  Constitutional:  Negative for chills and fever.  HENT:  Negative for congestion and sore throat.   Eyes:  Negative for double vision.  Respiratory:  Negative for cough, sputum production and shortness of breath.   Cardiovascular:  Negative for chest pain, palpitations and leg swelling.  Gastrointestinal:  Negative for abdominal pain, heartburn and nausea.  Genitourinary:  Negative for dysuria, frequency and hematuria.  Musculoskeletal:  Negative for falls and myalgias.  Neurological:  Negative for dizziness.  Psychiatric/Behavioral:  Positive for memory loss.    Negative unless indicated in HPI.   Past Medical History:  Diagnosis Date   Osteoporosis    Past Surgical History:  Procedure Laterality Date   APPENDECTOMY     MASTECTOMY Left    1984-diagnosis at 28   MELANOMA EXCISION WITH SENTINEL LYMPH NODE BIOPSY N/A 10/28/2021   Procedure: WIDE LOCAL EXCISION SCALP MELANOMA COVERAGE WITH DERMAL MATRIX;  Surgeon: Lockie Rima, MD;  Location: [facility] OR;  Service: General;  Laterality: N/A;   Social History:   reports that she has never smoked. She has never used smokeless tobacco. She  reports current alcohol use. She reports that she does not use drugs.  Family History  Problem Relation Age of Onset   Dementia Mother    Breast cancer Daughter 46   BRCA 1/2 Daughter        tested negative   BRCA 1/2 Daughter        tested negative    Medications: Patient's Medications  New Prescriptions   No medications on file  Previous Medications   ALENDRONATE (FOSAMAX) 70 MG TABLET    Take 70 mg by mouth every  Sunday.   APOAEQUORIN (PREVAGEN) 10 MG CAPS    Take 10 mg by mouth daily.   MULTIPLE VITAMINS-MINERALS (ADULT GUMMY PO)    Take 2 capsules by mouth daily.   TOLNAFTATE (ANTIFUNGAL EX)    Apply 1 Application topically 2 (two) times daily.   VITAMIN C (ASCORBIC ACID) 500 MG TABLET    Take 500 mg by mouth daily.   VITAMIN D PO    Take 1 capsule by mouth daily.  Modified Medications   No medications on file  Discontinued Medications   No medications on file    Physical Exam: Vitals:   01/24/24 1255  BP: 136/86  Pulse: 67  Resp: 16  Temp: (!) 97.4 F (36.3 C)  SpO2: 95%  Weight: 134 lb (60.8 kg)  Height: 5' (1.524 m)   Body mass index is 26.17 kg/m. BP Readings from Last 3 Encounters:  01/24/24 136/86  05/30/22 (!) 162/78  10/28/21 (!) 145/61   Wt Readings from Last 3 Encounters:  01/24/24 134 lb (60.8 kg)  05/30/22 142 lb (64.4 kg)  10/28/21 140 lb (63.5 kg)    Physical Exam Constitutional:      Appearance: Normal appearance.  HENT:     Head: Normocephalic and atraumatic.  Cardiovascular:     Rate and Rhythm: Normal rate and regular rhythm.  Pulmonary:     Effort: Pulmonary effort is normal. No respiratory distress.     Breath sounds: Normal breath sounds. No wheezing.  Abdominal:     General: Bowel sounds are normal. There is no distension.     Tenderness: There is no abdominal tenderness. There is no guarding or rebound.     Comments:    Musculoskeletal:        General: Swelling present.     Comments: 1+ pitting oedema   Neurological:     Mental Status: She is alert. Mental status is at baseline.     Motor: No weakness.     Labs reviewed: Basic Metabolic Panel: No results for input(s): "NA", "K", "CL", "CO2", "GLUCOSE", "BUN", "CREATININE", "CALCIUM", "MG", "PHOS", "TSH" in the last 8760 hours. Liver Function Tests: No results for input(s): "AST", "ALT", "ALKPHOS", "BILITOT", "PROT", "ALBUMIN" in the last 8760 hours. No results for input(s): "LIPASE",  "AMYLASE" in the last 8760 hours. No results for input(s): "AMMONIA" in the last 8760 hours. CBC: No results for input(s): "WBC", "NEUTROABS", "HGB", "HCT", "MCV", "PLT" in the last 8760 hours. Lipid Panel: No results for input(s): "CHOL", "HDL", "LDLCALC", "TRIG", "CHOLHDL", "LDLDIRECT" in the last 8760 hours. TSH: No results for input(s): "TSH" in the last 8760 hours. A1C: No results found for: "HGBA1C"  Assessment and Plan Assessment & Plan  1. Major neurocognitive disorder (HCC) (Primary) Increase cognitively engaging activities Will check [place] at next visit  - CBC (no diff) - Complete Metabolic Panel with eGFR - TSH  2. Malignant melanoma of scalp (HCC) Follow up with dermatology  3. Age  related osteoporosis, unspecified pathological fracture presence Will order dexa scan Cont with fosmax, cal, vit d  - Vitamin D, 25-hydroxy - TSH - DG BONE DENSITY (DXA); Future  4. Hyperlipidemia, unspecified hyperlipidemia type  - Lipid Panel  5. Venous insufficiency of leg Instructed patient to elevate feet  Use compression stockings  Avoid salty foods    48 min  Total time spent for obtaining history,  performing a medically appropriate examination and evaluation, reviewing the tests,ordering  tests,  documenting clinical information in the electronic or other health record ,care coordination (not separately reported)

## 2024-02-06 ENCOUNTER — Other Ambulatory Visit

## 2024-02-07 LAB — COMPLETE METABOLIC PANEL WITHOUT GFR
AG Ratio: 1.9 (calc) (ref 1.0–2.5)
ALT: 12 U/L (ref 6–29)
AST: 15 U/L (ref 10–35)
Albumin: 4.2 g/dL (ref 3.6–5.1)
Alkaline phosphatase (APISO): 64 U/L (ref 37–153)
BUN/Creatinine Ratio: 26 (calc) — ABNORMAL HIGH (ref 6–22)
BUN: 12 mg/dL (ref 7–25)
CO2: 25 mmol/L (ref 20–32)
Calcium: 9.4 mg/dL (ref 8.6–10.4)
Chloride: 102 mmol/L (ref 98–110)
Creat: 0.46 mg/dL — ABNORMAL LOW (ref 0.60–0.95)
Globulin: 2.2 g/dL (ref 1.9–3.7)
Glucose, Bld: 106 mg/dL — ABNORMAL HIGH (ref 65–99)
Potassium: 4.1 mmol/L (ref 3.5–5.3)
Sodium: 135 mmol/L (ref 135–146)
Total Bilirubin: 0.9 mg/dL (ref 0.2–1.2)
Total Protein: 6.4 g/dL (ref 6.1–8.1)

## 2024-02-07 LAB — CBC
HCT: 39.5 % (ref 35.0–45.0)
Hemoglobin: 13.1 g/dL (ref 11.7–15.5)
MCH: 32.1 pg (ref 27.0–33.0)
MCHC: 33.2 g/dL (ref 32.0–36.0)
MCV: 96.8 fL (ref 80.0–100.0)
MPV: 9.6 fL (ref 7.5–12.5)
Platelets: 271 10*3/uL (ref 140–400)
RBC: 4.08 10*6/uL (ref 3.80–5.10)
RDW: 12.1 % (ref 11.0–15.0)
WBC: 5.1 10*3/uL (ref 3.8–10.8)

## 2024-02-07 LAB — LIPID PANEL
Cholesterol: 269 mg/dL — ABNORMAL HIGH (ref <200)
HDL: 77 mg/dL (ref >=50)
LDL Cholesterol (Calc): 175 mg/dL — ABNORMAL HIGH
Non-HDL Cholesterol (Calc): 192 mg/dL — ABNORMAL HIGH (ref <130)
Total CHOL/HDL Ratio: 3.5 (calc) (ref <5.0)
Triglycerides: 69 mg/dL (ref <150)

## 2024-02-07 LAB — TSH: TSH: 1.3 m[IU]/L (ref 0.40–4.50)

## 2024-02-07 LAB — VITAMIN D 25 HYDROXY (VIT D DEFICIENCY, FRACTURES): Vit D, 25-Hydroxy: 43 ng/mL (ref 30–100)

## 2024-02-08 ENCOUNTER — Ambulatory Visit: Payer: Self-pay | Admitting: Sports Medicine

## 2024-02-08 NOTE — Telephone Encounter (Signed)
    Latest Ref Rng & Units 02/06/2024    8:56 AM 10/20/2021   11:19 AM  CBC  WBC 3.8 - 10.8 Thousand/uL 5.1  6.6   Hemoglobin 11.7 - 15.5 g/dL 16.1  09.6   Hematocrit 35.0 - 45.0 % 39.5  41.9   Platelets 140 - 400 Thousand/uL 271  275        Latest Ref Rng & Units 02/06/2024    8:56 AM 10/20/2021   11:19 AM  BMP  Glucose 65 - 99 mg/dL 045  409   BUN 7 - 25 mg/dL 12  8   Creatinine 8.11 - 0.95 mg/dL 9.14  7.82   BUN/Creat Ratio 6 - 22 (calc) 26    Sodium 135 - 146 mmol/L 135  134   Potassium 3.5 - 5.3 mmol/L 4.1  4.1   Chloride 98 - 110 mmol/L 102  101   CO2 20 - 32 mmol/L 25  25   Calcium 8.6 - 10.4 mg/dL 9.4  95.6     The ASCVD Risk score (Arnett DK, et al., 2019) failed to calculate for the following reasons:   The 2019 ASCVD risk score is only valid for ages 10 to 29

## 2024-05-13 ENCOUNTER — Encounter: Payer: Self-pay | Admitting: Sports Medicine

## 2024-05-16 HISTORY — PX: MELANOMA EXCISION: SHX5266

## 2024-05-23 NOTE — Telephone Encounter (Signed)
 I have called and spoken to patient daughter Christina Mcguire. She is agreeable to bring mother into office tomorrow 05/24/2024 at 1:40pm to see [name], NP. I let daughter know that she has many concerns in regards to her mother so we will still keep her respected follow up with [name], MD to address additional concerns. She is aware that tomorrow with [name], NP we will primarily focus on possible UTI, and possible pelvic prolapse. If time allows NP will address additional concerns. Message routed to [name], NP.

## 2024-05-23 NOTE — Telephone Encounter (Signed)
 I have sent MyChart message to patient daughter Macario.

## 2024-05-23 NOTE — Patient Instructions (Signed)
 1.) Please visit your local pharmacy to receive your zoster vaccine.  2.) Please schedule your annual wellness visit (video or in person)

## 2024-05-24 ENCOUNTER — Encounter: Payer: Self-pay | Admitting: Nurse Practitioner

## 2024-05-24 ENCOUNTER — Ambulatory Visit (INDEPENDENT_AMBULATORY_CARE_PROVIDER_SITE_OTHER): Admitting: Nurse Practitioner

## 2024-05-24 VITALS — BP 128/68 | HR 101 | Temp 98.1°F | Ht 60.0 in | Wt 121.2 lb

## 2024-05-24 DIAGNOSIS — R350 Frequency of micturition: Secondary | ICD-10-CM

## 2024-05-24 DIAGNOSIS — F0390 Unspecified dementia without behavioral disturbance: Secondary | ICD-10-CM | POA: Diagnosis not present

## 2024-05-24 DIAGNOSIS — N8110 Cystocele, unspecified: Secondary | ICD-10-CM

## 2024-05-24 DIAGNOSIS — K4020 Bilateral inguinal hernia, without obstruction or gangrene, not specified as recurrent: Secondary | ICD-10-CM | POA: Diagnosis not present

## 2024-05-24 NOTE — Progress Notes (Signed)
 Careteam: Patient Care Team: Sherlynn Madden, MD as PCP - General (Internal Medicine)  PLACE OF SERVICE:  [facility]  Advanced Directive information   No Known Allergies  Chief Complaint  Patient presents with   Medical Management of Chronic Issues    Patient's daughter states that her mom is having memory loss, dark urine(possible dehydration). Daughter wants physical exam of mothers pelvic area. Patient unable to leave urine sample. No urine frequency, itching or burning. Patient scheduled for flu vaccine on 06/17/24.    HPI:   Patient is a 83 y.o. female seen in today for a bulge in the vaginal canal. She is accompanied by her daughter who noticed the bulge during peri care. The patient denies vaginal pain, burning, itching. She further denies dysuria, hematuria, and changes to urinary frequency and urgency. No further complications verbalized.   Daughter is unsure of origin of bulge as the patient lives alone since loss of partner in July 2025.   Daughter has relocated to assist in care.   Review of Systems:  Review of Systems  Constitutional:  Negative for chills, fever and malaise/fatigue.  Respiratory:  Negative for shortness of breath.   Cardiovascular:  Negative for chest pain.  Genitourinary:  Negative for dysuria, frequency, hematuria and urgency.  Skin:  Negative for itching and rash.  Neurological:  Negative for dizziness and weakness.  Psychiatric/Behavioral:  Negative for memory loss.     Past Medical History:  Diagnosis Date   Cancer (HCC) 1984   Breast Cancer; Mastectomy right breast   Cataract 02/2023   Dr offered cataract surgery but I declined   Osteoporosis    Past Surgical History:  Procedure Laterality Date   APPENDECTOMY     BREAST SURGERY  1984   Mastectomy rt breast   MASTECTOMY Left    1984-diagnosis at 7   MELANOMA EXCISION Right 05/16/2024   Given by patient and her daughter   MELANOMA EXCISION WITH SENTINEL LYMPH NODE  BIOPSY N/A 10/28/2021   Procedure: WIDE LOCAL EXCISION SCALP MELANOMA COVERAGE WITH DERMAL MATRIX;  Surgeon: Aron Shoulders, MD;  Location: [facility] OR;  Service: General;  Laterality: N/A;   Social History:   reports that she has never smoked. She has never used smokeless tobacco. She reports current alcohol use. She reports that she does not use drugs.  Family History  Problem Relation Age of Onset   Dementia Mother    Breast cancer Daughter 64   BRCA 1/2 Daughter        tested negative   BRCA 1/2 Daughter        tested negative    Medications: Patient's Medications  New Prescriptions   No medications on file  Previous Medications   ALENDRONATE (FOSAMAX) 70 MG TABLET    Take 70 mg by mouth every Sunday.   APOAEQUORIN (PREVAGEN) 10 MG CAPS    Take 10 mg by mouth daily.   MULTIPLE VITAMINS-MINERALS (ADULT GUMMY PO)    Take 2 capsules by mouth daily.   VITAMIN C (ASCORBIC ACID) 500 MG TABLET    Take 500 mg by mouth daily.   VITAMIN D  PO    Take 1 capsule by mouth daily.  Modified Medications   No medications on file  Discontinued Medications   No medications on file    Physical Exam:  Vitals:   05/23/24 1635  BP: 128/68  Pulse: (!) 101  Temp: 98.1 F (36.7 C)  SpO2: 96%  Weight: 121 lb 3.2 oz (55  kg)  Height: 5' (1.524 m)   Body mass index is 26.17 kg/m. Wt Readings from Last 3 Encounters:  05/23/24 121 lb 3.2 oz (55 kg)  01/24/24 134 lb (60.8 kg)  05/30/22 142 lb (64.4 kg)    Physical Exam Constitutional:      General: She is not in acute distress.    Appearance: Normal appearance.  Abdominal:     General: Abdomen is flat.     Palpations: Abdomen is soft.     Hernia: A hernia is present. Hernia is present in the left inguinal area and right inguinal area.     Comments: Bilateral inguinal hernia without pain and easily reducible   Genitourinary:    General: Normal vulva.     Urethra: Prolapse present.     Vagina: No vaginal discharge.     Rectum: Normal.      Comments: Bladder prolapsed into vaginal canal Skin:    General: Skin is warm and dry.     Findings: No bruising, erythema or rash.  Neurological:     Mental Status: She is alert.  Psychiatric:        Mood and Affect: Mood normal.        Behavior: Behavior normal.     Labs reviewed: Basic Metabolic Panel: Recent Labs    02/06/24 0856  NA 135  K 4.1  CL 102  CO2 25  GLUCOSE 106*  BUN 12  CREATININE 0.46*  CALCIUM 9.4  TSH 1.30   Liver Function Tests: Recent Labs    02/06/24 0856  AST 15  ALT 12  BILITOT 0.9  PROT 6.4   No results for input(s): LIPASE, AMYLASE in the last 8760 hours. No results for input(s): AMMONIA in the last 8760 hours. CBC: Recent Labs    02/06/24 0856  WBC 5.1  HGB 13.1  HCT 39.5  MCV 96.8  PLT 271   Lipid Panel: Recent Labs    02/06/24 0856  CHOL 269*  HDL 77  LDLCALC 175*  TRIG 69  CHOLHDL 3.5   TSH: Recent Labs    02/06/24 0856  TSH 1.30   A1C: No results found for: HGBA1C   Assessment/Plan  1. Bladder prolapse, female, acquired (Primary) - pt denies any symptoms at this time of pain or discomfort  -Ambulatory referral to Urology  -Discussed proactive management to prevent complications.  2. Bilateral inguinal hernia without obstruction or gangrene, recurrence not specified Bilateral inguinal hernia reducible and asymptomatic. Discussed risk of strangulation if painful and irreducible, requiring emergency intervention. - Monitor for symptom changes, especially pain or irreducibility. - Educated on strangulation signs and emergency care.  3. Major neurocognitive disorder [facility]) - daughter has noticed more memory decline. Ongoing follow up by PCP    Solomon Blumenthal, FNP-S I personally was present during the history, physical exam and medical decision-making activities of this service and have verified that the service and findings are accurately documented in the student's note Anay Rathe K. Caro BODILY [facility] [phone]

## 2024-05-28 ENCOUNTER — Ambulatory Visit (INDEPENDENT_AMBULATORY_CARE_PROVIDER_SITE_OTHER): Admitting: Sports Medicine

## 2024-05-28 ENCOUNTER — Encounter: Payer: Self-pay | Admitting: Sports Medicine

## 2024-05-28 VITALS — BP 118/62 | HR 100 | Temp 97.4°F | Resp 18 | Ht 60.0 in | Wt 120.8 lb

## 2024-05-28 DIAGNOSIS — R634 Abnormal weight loss: Secondary | ICD-10-CM | POA: Diagnosis not present

## 2024-05-28 DIAGNOSIS — R413 Other amnesia: Secondary | ICD-10-CM | POA: Diagnosis not present

## 2024-05-28 MED ORDER — COVID-19 MRNA VACC (MODERNA) 50 MCG/0.5ML IM SUSY: 0.5000 mL | PREFILLED_SYRINGE | INTRAMUSCULAR | 1 refills | Status: AC

## 2024-05-28 NOTE — Patient Instructions (Signed)
 1) Our records indicate you are due for an annual wellness visit, stop at check out to schedule (video or in-person).    2) Please visit your local pharmacy to receive your  tetanus, and Shingrix vaccine, if you have not already received.

## 2024-05-28 NOTE — Progress Notes (Signed)
 Careteam: Patient Care Team: Sherlynn Madden, MD as PCP - General (Internal Medicine)  PLACE OF SERVICE:  [facility]  Advanced Directive information    No Known Allergies  Chief Complaint  Patient presents with   Medical Management of Chronic Issues    4 months, needs to discuss Covid booster, tetanus, Shingrix and Pneumonia vaccine. Medicare Annual Wellness visit. Patient is not due for flu vaccine until 06/18/24     Discussed the use of AI scribe software for clinical note transcription with the patient, who gave verbal consent to proceed.  History of Present Illness  Christina Mcguire is an 83 year old female who presents with memory issues and weight loss.  She has been experiencing cognitive decline, which has been gradually worsening. Her daughter notes that since the passing of her father, memory issues have become more noticeable. She has difficulty remembering names and people, and there is a new onset of bowel incontinence. No hallucinations or agitation are present. She resides in an independent living facility but requires daily visits from her daughter.    Significant weight loss has occurred, with her weight dropping from 134 pounds in May to 120 pounds currently. Her daughter discovered that in August, she only ate 14 meals over 31 days, indicating a significant decrease in food intake. Her daughter is actively trying to ensure she eats and drinks more regularly.  Her social activities include participating in exercise classes and Tai Chi at her facility, although she does not enjoy exercise. She engages in various programs offered at the facility, which helps maintain some level of activity.  Her current medication regimen includes Fosamax, and she takes various supplements and over-the-counter vitamins. There is no mention of other prescription medications.  No hallucinations, agitation, depression, breathing difficulties, chest pain, urinary symptoms, or  dizziness.    Review of Systems:  Review of Systems  Constitutional:  Negative for chills and fever.  HENT:  Negative for congestion and sore throat.   Respiratory:  Negative for cough, sputum production and shortness of breath.   Cardiovascular:  Negative for chest pain, palpitations and leg swelling.  Gastrointestinal:  Negative for abdominal pain, heartburn and nausea.  Genitourinary:  Negative for dysuria, frequency and hematuria.  Musculoskeletal:  Negative for falls and myalgias.  Neurological:  Negative for dizziness.  Psychiatric/Behavioral:  Positive for memory loss.    Negative unless indicated in HPI.   Past Medical History:  Diagnosis Date   Cancer (HCC) 1984   Breast Cancer; Mastectomy right breast   Cataract 02/2023   Dr offered cataract surgery but I declined   Osteoporosis    Past Surgical History:  Procedure Laterality Date   APPENDECTOMY     BREAST SURGERY  1984   Mastectomy rt breast   MASTECTOMY Left    1984-diagnosis at 49   MELANOMA EXCISION Right 05/16/2024   Given by patient and her daughter   MELANOMA EXCISION WITH SENTINEL LYMPH NODE BIOPSY N/A 10/28/2021   Procedure: WIDE LOCAL EXCISION SCALP MELANOMA COVERAGE WITH DERMAL MATRIX;  Surgeon: Aron Shoulders, MD;  Location: [facility] OR;  Service: General;  Laterality: N/A;   Social History:   reports that she has never smoked. She has never used smokeless tobacco. She reports current alcohol use. She reports that she does not use drugs.  Family History  Problem Relation Age of Onset   Dementia Mother    Breast cancer Daughter 57   BRCA 1/2 Daughter        tested negative  BRCA 1/2 Daughter        tested negative    Medications: Patient's Medications  New Prescriptions   No medications on file  Previous Medications   ALENDRONATE (FOSAMAX) 70 MG TABLET    Take 70 mg by mouth every Sunday.   APOAEQUORIN (PREVAGEN) 10 MG CAPS    Take 10 mg by mouth daily.   MULTIPLE VITAMINS-MINERALS (ADULT GUMMY  PO)    Take 2 capsules by mouth daily.   VITAMIN C (ASCORBIC ACID) 500 MG TABLET    Take 500 mg by mouth daily.   VITAMIN D PO    Take 1 capsule by mouth daily.  Modified Medications   No medications on file  Discontinued Medications   No medications on file    Physical Exam: There were no vitals filed for this visit. There is no height or weight on file to calculate BMI. BP Readings from Last 3 Encounters:  05/23/24 128/68  01/24/24 136/86  05/30/22 (!) 162/78   Wt Readings from Last 3 Encounters:  05/23/24 121 lb 3.2 oz (55 kg)  01/24/24 134 lb (60.8 kg)  05/30/22 142 lb (64.4 kg)    Physical Exam Constitutional:      Appearance: Normal appearance.  HENT:     Head: Normocephalic and atraumatic.  Cardiovascular:     Rate and Rhythm: Normal rate and regular rhythm.  Pulmonary:     Effort: Pulmonary effort is normal. No respiratory distress.     Breath sounds: Normal breath sounds. No wheezing.  Abdominal:     General: Bowel sounds are normal. There is no distension.     Tenderness: There is no abdominal tenderness. There is no guarding or rebound.     Comments:    Musculoskeletal:        General: Swelling present.  Neurological:     Mental Status: She is alert. Mental status is at baseline.     Motor: No weakness.     Labs reviewed: Basic Metabolic Panel: Recent Labs    02/06/24 0856  NA 135  K 4.1  CL 102  CO2 25  GLUCOSE 106*  BUN 12  CREATININE 0.46*  CALCIUM 9.4  TSH 1.30   Liver Function Tests: Recent Labs    02/06/24 0856  AST 15  ALT 12  BILITOT 0.9  PROT 6.4   No results for input(s): LIPASE, AMYLASE in the last 8760 hours. No results for input(s): AMMONIA in the last 8760 hours. CBC: Recent Labs    02/06/24 0856  WBC 5.1  HGB 13.1  HCT 39.5  MCV 96.8  PLT 271   Lipid Panel: Recent Labs    02/06/24 0856  CHOL 269*  HDL 77  LDLCALC 175*  TRIG 69  CHOLHDL 3.5   TSH: Recent Labs    02/06/24 0856  TSH 1.30    A1C: No results found for: HGBA1C  Assessment and Plan Assessment & Plan  1. Weight loss (Primary)  - CBC (no diff) - Basic Metabolic Panel  2. Memory impairment [place] 13/30   Discussed with daughter dementia, medications, safety  Declined MRI  Discussed regarding medications, would not recommend at this time due to weight loss Recommended to have a aid to supervise or moving to assisted living -daughter requested a referral to neuropsychology Ambulatory referral to Neuropsychology  Other orders - COVID-19 mRNA vaccine (SPIKEVAX) syringe; Inject 0.5 mLs into the muscle as directed.  Dispense: 0.5 mL; Refill: 1     40  min Total time  spent for obtaining history,  performing a medically appropriate examination and evaluation, reviewing the tests,ordering  tests,  documenting clinical information in the electronic or other health record, independently interpreting results ,care coordination (not separately reported)

## 2024-05-29 ENCOUNTER — Ambulatory Visit: Payer: Self-pay | Admitting: Sports Medicine

## 2024-05-31 ENCOUNTER — Telehealth: Payer: Self-pay | Admitting: Sports Medicine

## 2024-05-31 LAB — CBC
HCT: 35 % (ref 35.0–45.0)
Hemoglobin: 11.3 g/dL — ABNORMAL LOW (ref 11.7–15.5)
MCH: 30.1 pg (ref 27.0–33.0)
MCHC: 32.3 g/dL (ref 32.0–36.0)
MCV: 93.1 fL (ref 80.0–100.0)
MPV: 9.2 fL (ref 7.5–12.5)
Platelets: 472 Thousand/uL — ABNORMAL HIGH (ref 140–400)
RBC: 3.76 Million/uL — ABNORMAL LOW (ref 3.80–5.10)
RDW: 12.6 % (ref 11.0–15.0)
WBC: 8.8 Thousand/uL (ref 3.8–10.8)

## 2024-05-31 LAB — IRON,TIBC AND FERRITIN PANEL
%SAT: 15 % — ABNORMAL LOW (ref 16–45)
Ferritin: 1251 ng/mL — ABNORMAL HIGH (ref 16–288)
Iron: 29 ug/dL — ABNORMAL LOW (ref 45–160)
TIBC: 191 ug/dL — ABNORMAL LOW (ref 250–450)

## 2024-05-31 LAB — BASIC METABOLIC PANEL WITH GFR
BUN/Creatinine Ratio: 48 (calc) — ABNORMAL HIGH (ref 6–22)
BUN: 20 mg/dL (ref 7–25)
CO2: 30 mmol/L (ref 20–32)
Calcium: 9.7 mg/dL (ref 8.6–10.4)
Chloride: 96 mmol/L — ABNORMAL LOW (ref 98–110)
Creat: 0.42 mg/dL — ABNORMAL LOW (ref 0.60–0.95)
Glucose, Bld: 88 mg/dL (ref 65–99)
Potassium: 4 mmol/L (ref 3.5–5.3)
Sodium: 132 mmol/L — ABNORMAL LOW (ref 135–146)
eGFR: 97 mL/min/1.73m2 (ref >=60)

## 2024-05-31 LAB — VITAMIN B12: Vitamin B-12: >2000 pg/mL — ABNORMAL HIGH (ref 200–1100)

## 2024-05-31 LAB — TEST AUTHORIZATION

## 2024-05-31 NOTE — Telephone Encounter (Signed)
 Patients daughter dropped off a form to be completed and signed by provider for [facility]. Given to Chrae in [facility]

## 2024-05-31 NOTE — Telephone Encounter (Signed)
 Patient's daughter dropped off urine , there were no orders in for patients urine from either 05/24/24 or 05/28/24 informed lab tech of this matter for labtech to discard urine. Spoke with both providers that patient had seen within the 4 days and the only order that was in was a referral to urology.

## 2024-06-03 ENCOUNTER — Ambulatory Visit: Payer: Self-pay

## 2024-06-03 NOTE — Telephone Encounter (Signed)
 FYI Only or Action Required?: FYI only for provider.  Patient was last seen in primary care on 05/28/2024 by Sherlynn Madden, MD.  Called Nurse Triage reporting Extremity Weakness.  Symptoms began about a month ago.  Interventions attempted: Nothing.  Symptoms are: gradually worsening.  Triage Disposition: See PCP When Office is Open (Within 3 Days)  Patient/caregiver understands and will follow disposition?: Yes Reason for Disposition  Nursing judgment or information in reference  Answer Assessment - Initial Assessment Questions Daughter Macario calling in for patient's decrease in mobility. Patient used to be able to walk with walker, up and down steps with someone next to her,living independently, patients daughter states she can barely stand, could not get up 3 steps. Resting HR 77, after walking HR 120-130. Patients daughter thinks its general weakness rather than confusion. Denies numbness. Hernia, prolapsed bladder.   1. REASON FOR CALL: What is your main concern right now?     Decrease in mobility in both legs.   2. ONSET: When did the Decrease in Mobility start?     Getting worse end of August/beginning of September  3. SEVERITY: How bad is the decrease in mobility?     Getting worse over the last 2 weeks, daughter now scared to leave her alone  4. OTHER SYMPTOMS: Do you have any other new symptoms?     Denies pain, has moderate cognition decline  Protocols used: No Guideline Available-[facility]  Copied from CRM #[id number]. Topic: Clinical - Red Word Triage >> Jun 03, 2024 12:37 PM Zane F wrote: Red Word that prompted transfer to Nurse Triage:   Concern: Heart rate increases to 130 when walking; mobility has made a serious decline in just 4 short weeks; leg weakness; acute back pain occurred two weeks ago   When did the symptoms start?: Last week of August   What have you done to aid in the concern ? Have you taken anything to assist with the matter?: No

## 2024-06-03 NOTE — Telephone Encounter (Signed)
 Her sodium was low 132 on recent labs If patient is declining, generalized weakness , I would recommend her going to ED .

## 2024-06-03 NOTE — Telephone Encounter (Addendum)
 Patients daughter and she verbalized understanding of Dr.Veludandi's recommendation and asked what the status of urine sample was. I placed her on hold while I spoke with lab tech.  Per the lab tech: 1.) Urine sample was dropped  2.) [place] asked provider about an order and was told that she did not advise for patient to drop off a urine sample, however patient was referred to a urologist. 3.) Urine was discarded  Call resumed with daughter, explained the above, and she expressed her frustration  DAMN, it too me so long to get that urine sample, when she was there to see Harlene we could not get a sample, and I was told I could take cup and bring urine sample back. Macario said she is so disappointed and no one had the courtesy to call her and ask her prior to discarding urine. Macario asked that we keep appointment for Wednesday for now and she will cancel online if she decided to take mother to the ED.

## 2024-06-03 NOTE — Telephone Encounter (Signed)
 CC :  Spoke with both providers regarding patient leaving urine sample. Pt was seen by [name] Np on 05/24/24 with Powell Dickens as CMA. I was informed from both providers that they didn't need a sample and when pt seen Dr. Sherlynn she wasn't seen for urine. [name] NP put in an order for pt to go to Urology. Was unable to speak with CMA who was assisting during patients visit due to CMA no longer with us .Noted.

## 2024-06-04 ENCOUNTER — Emergency Department (HOSPITAL_BASED_OUTPATIENT_CLINIC_OR_DEPARTMENT_OTHER)

## 2024-06-04 ENCOUNTER — Encounter (HOSPITAL_BASED_OUTPATIENT_CLINIC_OR_DEPARTMENT_OTHER): Payer: Self-pay

## 2024-06-04 ENCOUNTER — Other Ambulatory Visit: Payer: Self-pay

## 2024-06-04 ENCOUNTER — Emergency Department (HOSPITAL_BASED_OUTPATIENT_CLINIC_OR_DEPARTMENT_OTHER): Admitting: Radiology

## 2024-06-04 ENCOUNTER — Inpatient Hospital Stay (HOSPITAL_BASED_OUTPATIENT_CLINIC_OR_DEPARTMENT_OTHER)
Admission: EM | Admit: 2024-06-04 | Discharge: 2024-06-06 | DRG: 596 | Disposition: A | Discharge status: Hospice | Source: Skilled Nursing Facility | Attending: Internal Medicine | Admitting: Internal Medicine

## 2024-06-04 DIAGNOSIS — Z79899 Other long term (current) drug therapy: Secondary | ICD-10-CM | POA: Diagnosis not present

## 2024-06-04 DIAGNOSIS — M810 Age-related osteoporosis without current pathological fracture: Secondary | ICD-10-CM | POA: Diagnosis present

## 2024-06-04 DIAGNOSIS — R627 Adult failure to thrive: Secondary | ICD-10-CM | POA: Diagnosis present

## 2024-06-04 DIAGNOSIS — R609 Edema, unspecified: Secondary | ICD-10-CM | POA: Diagnosis not present

## 2024-06-04 DIAGNOSIS — I493 Ventricular premature depolarization: Secondary | ICD-10-CM | POA: Diagnosis present

## 2024-06-04 DIAGNOSIS — Z6823 Body mass index (BMI) 23.0-23.9, adult: Secondary | ICD-10-CM

## 2024-06-04 DIAGNOSIS — E222 Syndrome of inappropriate secretion of antidiuretic hormone: Secondary | ICD-10-CM | POA: Diagnosis present

## 2024-06-04 DIAGNOSIS — Z515 Encounter for palliative care: Secondary | ICD-10-CM

## 2024-06-04 DIAGNOSIS — Z85820 Personal history of malignant melanoma of skin: Secondary | ICD-10-CM

## 2024-06-04 DIAGNOSIS — Z66 Do not resuscitate: Secondary | ICD-10-CM | POA: Diagnosis present

## 2024-06-04 DIAGNOSIS — C799 Secondary malignant neoplasm of unspecified site: Secondary | ICD-10-CM | POA: Diagnosis present

## 2024-06-04 DIAGNOSIS — Z7189 Other specified counseling: Secondary | ICD-10-CM | POA: Diagnosis not present

## 2024-06-04 DIAGNOSIS — Z803 Family history of malignant neoplasm of breast: Secondary | ICD-10-CM

## 2024-06-04 DIAGNOSIS — Z853 Personal history of malignant neoplasm of breast: Secondary | ICD-10-CM

## 2024-06-04 DIAGNOSIS — Z9012 Acquired absence of left breast and nipple: Secondary | ICD-10-CM

## 2024-06-04 DIAGNOSIS — C7951 Secondary malignant neoplasm of bone: Secondary | ICD-10-CM | POA: Diagnosis present

## 2024-06-04 DIAGNOSIS — C3490 Malignant neoplasm of unspecified part of unspecified bronchus or lung: Secondary | ICD-10-CM | POA: Diagnosis present

## 2024-06-04 DIAGNOSIS — R0602 Shortness of breath: Secondary | ICD-10-CM | POA: Diagnosis present

## 2024-06-04 DIAGNOSIS — C439 Malignant melanoma of skin, unspecified: Principal | ICD-10-CM | POA: Diagnosis present

## 2024-06-04 DIAGNOSIS — R531 Weakness: Secondary | ICD-10-CM

## 2024-06-04 DIAGNOSIS — C787 Secondary malignant neoplasm of liver and intrahepatic bile duct: Secondary | ICD-10-CM | POA: Diagnosis present

## 2024-06-04 DIAGNOSIS — C786 Secondary malignant neoplasm of retroperitoneum and peritoneum: Secondary | ICD-10-CM | POA: Diagnosis present

## 2024-06-04 DIAGNOSIS — E871 Hypo-osmolality and hyponatremia: Secondary | ICD-10-CM | POA: Diagnosis not present

## 2024-06-04 DIAGNOSIS — F0390 Unspecified dementia without behavioral disturbance: Secondary | ICD-10-CM | POA: Diagnosis present

## 2024-06-04 DIAGNOSIS — R8271 Bacteriuria: Secondary | ICD-10-CM | POA: Diagnosis present

## 2024-06-04 DIAGNOSIS — C781 Secondary malignant neoplasm of mediastinum: Secondary | ICD-10-CM | POA: Diagnosis present

## 2024-06-04 DIAGNOSIS — R000 Tachycardia, unspecified: Secondary | ICD-10-CM | POA: Diagnosis present

## 2024-06-04 LAB — BASIC METABOLIC PANEL WITH GFR
Anion gap: 18 — ABNORMAL HIGH (ref 5–15)
BUN: 15 mg/dL (ref 8–23)
CO2: 18 mmol/L — ABNORMAL LOW (ref 22–32)
Calcium: 9.4 mg/dL (ref 8.9–10.3)
Chloride: 87 mmol/L — ABNORMAL LOW (ref 98–111)
Creatinine, Ser: 0.46 mg/dL (ref 0.44–1.00)
GFR, Estimated: >60 mL/min (ref >60)
Glucose, Bld: 95 mg/dL (ref 70–99)
Potassium: 3.6 mmol/L (ref 3.5–5.1)
Sodium: 123 mmol/L — ABNORMAL LOW (ref 135–145)

## 2024-06-04 LAB — URINALYSIS, ROUTINE W REFLEX MICROSCOPIC
Glucose, UA: NEGATIVE mg/dL
Ketones, ur: 15 mg/dL — AB
Nitrite: NEGATIVE
Protein, ur: 30 mg/dL — AB
Specific Gravity, Urine: 1.022 (ref 1.005–1.030)
Trans Epithel, UA: 7
WBC, UA: >50 WBC/hpf (ref 0–5)
pH: 6 (ref 5.0–8.0)

## 2024-06-04 LAB — CBC WITH DIFFERENTIAL/PLATELET
Abs Immature Granulocytes: 0.48 K/uL — ABNORMAL HIGH (ref 0.00–0.07)
Basophils Absolute: 0 K/uL (ref 0.0–0.1)
Basophils Relative: 0 %
Eosinophils Absolute: 0 K/uL (ref 0.0–0.5)
Eosinophils Relative: 0 %
HCT: 33.6 % — ABNORMAL LOW (ref 36.0–46.0)
Hemoglobin: 11.3 g/dL — ABNORMAL LOW (ref 12.0–15.0)
Immature Granulocytes: 4 %
Lymphocytes Relative: 14 %
Lymphs Abs: 1.7 K/uL (ref 0.7–4.0)
MCH: 30.2 pg (ref 26.0–34.0)
MCHC: 33.6 g/dL (ref 30.0–36.0)
MCV: 89.8 fL (ref 80.0–100.0)
Monocytes Absolute: 0.7 K/uL (ref 0.1–1.0)
Monocytes Relative: 6 %
Neutro Abs: 9.4 K/uL — ABNORMAL HIGH (ref 1.7–7.7)
Neutrophils Relative %: 76 %
Platelets: 320 K/uL (ref 150–400)
RBC: 3.74 MIL/uL — ABNORMAL LOW (ref 3.87–5.11)
RDW: 13.9 % (ref 11.5–15.5)
WBC: 12.4 K/uL — ABNORMAL HIGH (ref 4.0–10.5)
nRBC: 0 % (ref 0.0–0.2)

## 2024-06-04 LAB — HEPATIC FUNCTION PANEL
ALT: 8 U/L (ref 0–44)
AST: 37 U/L (ref 15–41)
Albumin: 3 g/dL — ABNORMAL LOW (ref 3.5–5.0)
Alkaline Phosphatase: 150 U/L — ABNORMAL HIGH (ref 38–126)
Bilirubin, Direct: 0.4 mg/dL — ABNORMAL HIGH (ref 0.0–0.2)
Indirect Bilirubin: 0.5 mg/dL (ref 0.3–0.9)
Total Bilirubin: 0.9 mg/dL (ref 0.0–1.2)
Total Protein: 5.4 g/dL — ABNORMAL LOW (ref 6.5–8.1)

## 2024-06-04 LAB — SODIUM: Sodium: 125 mmol/L — ABNORMAL LOW (ref 135–145)

## 2024-06-04 LAB — TROPONIN T, HIGH SENSITIVITY
Troponin T High Sensitivity: 34 ng/L — ABNORMAL HIGH (ref 0–19)
Troponin T High Sensitivity: 33 ng/L — ABNORMAL HIGH (ref 0–19)

## 2024-06-04 LAB — LIPASE, BLOOD: Lipase: 28 U/L (ref 11–51)

## 2024-06-04 MED ORDER — SENNOSIDES-DOCUSATE SODIUM 8.6-50 MG PO TABS: 1.0000 | ORAL_TABLET | Freq: Every evening | ORAL | Status: DC | PRN

## 2024-06-04 MED ORDER — ENOXAPARIN SODIUM 40 MG/0.4ML IJ SOSY
40.0000 mg | PREFILLED_SYRINGE | INTRAMUSCULAR | Status: DC
  Administered 2024-06-04 – 2024-06-05 (×2): 40 mg via SUBCUTANEOUS
  Filled 2024-06-04 (×2): qty 0.4

## 2024-06-04 MED ORDER — ACETAMINOPHEN 325 MG PO TABS: 650.0000 mg | ORAL_TABLET | Freq: Four times a day (QID) | ORAL | Status: DC | PRN

## 2024-06-04 MED ORDER — ONDANSETRON HCL 4 MG/2ML IJ SOLN: 4.0000 mg | Freq: Four times a day (QID) | INTRAMUSCULAR | Status: DC | PRN

## 2024-06-04 MED ORDER — SODIUM CHLORIDE 0.9 % IV BOLUS
500.0000 mL | Freq: Once | INTRAVENOUS | Status: AC
  Administered 2024-06-04: 500 mL via INTRAVENOUS

## 2024-06-04 MED ORDER — ONDANSETRON HCL 4 MG PO TABS: 4.0000 mg | ORAL_TABLET | Freq: Four times a day (QID) | ORAL | Status: DC | PRN

## 2024-06-04 MED ORDER — SODIUM CHLORIDE 0.9% FLUSH
3.0000 mL | Freq: Two times a day (BID) | INTRAVENOUS | Status: DC
  Administered 2024-06-04 – 2024-06-06 (×4): 3 mL via INTRAVENOUS

## 2024-06-04 MED ORDER — IOHEXOL 300 MG/ML  SOLN
100.0000 mL | Freq: Once | INTRAMUSCULAR | Status: AC | PRN
  Administered 2024-06-04: 80 mL via INTRAVENOUS

## 2024-06-04 MED ORDER — ACETAMINOPHEN 650 MG RE SUPP: 650.0000 mg | Freq: Four times a day (QID) | RECTAL | Status: DC | PRN

## 2024-06-04 NOTE — H&P (Signed)
 History and Physical    Christina Mcguire FMW:[id number] DOB: [birth year] DOA: 06/04/2024  PCP: Sherlynn Madden, MD   Patient coming from: ILF   Chief Complaint: General weakness, loss of appetite, weight-loss   HPI: Christina Mcguire is an 83 y.o. 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  Patient is accompanied by her daughter who is her POA and helps with the history.  Patient has been experiencing some memory loss since 2021 but has been living independently and had been able to ambulate with a walker until recent weakness. Over the past 6 weeks, patient has had a loss of appetite with 14 pound weight loss and progressive generalized weakness.  She now requires intensive assistance just to stand.    The patient reports becoming short of breath with activity recently but denies cough, chest pain, headache, fever or chills.  She denies nausea, vomiting, diarrhea, or abdominal pain but has not had an appetite for the past 6 weeks.    Her daughter has been encouraging her to drink a lot of water recently but has not noted development of bilateral lower extremity edema over the past couple weeks.  [facility] ED Course: Upon arrival to the ED, patient is found to be afebrile and saturating low 90s on room air with tachycardia and stable blood pressure.  Labs are most notable for sodium 123, normal creatinine, WBC 12,400, and troponin 34 and then 33.  CT is concerning for widespread metastatic disease throughout the lower mediastinum, retroperitoneum, mesentery, subcutaneous tissues, and widespread hepatic and osseous metastasis with pathologic rib fractures.    Patient was given 500 mL of NS and transferred to [facility] for admission.  Review of Systems:  All other systems reviewed and apart from  HPI, are negative.  Past Medical History:  Diagnosis Date   Cancer (HCC) 1984   Breast Cancer; Mastectomy left breast   Cataract 02/2023   Dr offered cataract surgery but I declined   Osteoporosis     Past Surgical History:  Procedure Laterality Date   APPENDECTOMY     BREAST SURGERY  1984   Mastectomy rt breast   MASTECTOMY Left    1984-diagnosis at 16   MELANOMA EXCISION Right 05/16/2024   Given by patient and her daughter   MELANOMA EXCISION WITH SENTINEL LYMPH NODE BIOPSY N/A 10/28/2021   Procedure: WIDE LOCAL EXCISION SCALP MELANOMA COVERAGE WITH DERMAL MATRIX;  Surgeon: Aron Shoulders, MD;  Location: [facility] OR;  Service: General;  Laterality: N/A;    Social History:   reports that she has never smoked. She has never used smokeless tobacco. She reports current alcohol use. She reports that she does not use drugs.  No Known Allergies  Family History  Problem Relation Age of Onset   Dementia Mother    Breast cancer Daughter 25   BRCA 1/2 Daughter        tested negative   BRCA 1/2 Daughter        tested negative     Prior to Admission medications   Medication Sig Start Date End Date Taking? Authorizing Provider  Apoaequorin (PREVAGEN) 10 MG CAPS Take 10 mg by mouth daily.   Yes [provider]  Multiple Vitamins-Minerals (ADULT GUMMY PO) Take 2 capsules by mouth daily.   Yes [provider]  vitamin C (ASCORBIC ACID) 500 MG tablet  Take 500 mg by mouth daily.   Yes [provider]  VITAMIN D  PO Take 1 capsule by mouth daily.   Yes [provider]  COVID-19 mRNA vaccine (SPIKEVAX) syringe Inject 0.5 mLs into the muscle as directed. 05/28/24   Sherlynn Madden, MD    Physical Exam: Vitals:   06/04/24 1500 06/04/24 1600 06/04/24 1700 06/04/24 1842  BP: 117/76 130/78 98/60 (!) 141/80  Pulse: (!) 113 (!) 108 (!) 106 (!) 110  Resp: (!) 27 (!) 26 (!) 21 (!) 22  Temp:    98 F (36.7 C)  TempSrc:    Oral  SpO2: 91% 94% 92% 98%   Weight:      Height:        Constitutional: NAD, calm  Eyes: PERTLA, lids and conjunctivae normal ENMT: Mucous membranes are moist. Posterior pharynx clear of any exudate or lesions.   Neck: supple, no masses  Respiratory: no wheezing, no crackles. No accessory muscle use.  Cardiovascular: S1 & S2 heard, regular rate and rhythm. Pretibial pitting edema bilaterally.   Abdomen: No tenderness, soft. Bowel sounds active.  Musculoskeletal: no clubbing / cyanosis. No joint deformity upper and lower extremities.   Skin: no significant rashes, lesions, ulcers. Warm, dry, well-perfused. Neurologic: CN 2-12 grossly intact. Moving all extremities. Alert and oriented.  Psychiatric: Pleasant. Cooperative.    Labs and Imaging on Admission: I have personally reviewed following labs and imaging studies  CBC: Recent Labs  Lab 06/04/24 1054  WBC 12.4*  NEUTROABS 9.4*  HGB 11.3*  HCT 33.6*  MCV 89.8  PLT 320   Basic Metabolic Panel: Recent Labs  Lab 06/04/24 1054  NA 123*  K 3.6  CL 87*  CO2 18*  GLUCOSE 95  BUN 15  CREATININE 0.46  CALCIUM 9.4   GFR: Estimated Creatinine Clearance: 41.4 mL/min (by C-G formula based on SCr of 0.46 mg/dL). Liver Function Tests: Recent Labs  Lab 06/04/24 1054  AST 37  ALT 8  ALKPHOS 150*  BILITOT 0.9  PROT 5.4*  ALBUMIN 3.0*   Recent Labs  Lab 06/04/24 1054  LIPASE 28   No results for input(s): AMMONIA in the last 168 hours. Coagulation Profile: No results for input(s): INR, PROTIME in the last 168 hours. Cardiac Enzymes: No results for input(s): CKTOTAL, CKMB, CKMBINDEX, TROPONINI in the last 168 hours. BNP (last 3 results) No results for input(s): PROBNP in the last 8760 hours. HbA1C: No results for input(s): HGBA1C in the last 72 hours. CBG: No results for input(s): GLUCAP in the last 168 hours. Lipid Profile: No results for input(s): CHOL, HDL, LDLCALC, TRIG, CHOLHDL, LDLDIRECT in the last 72  hours. Thyroid  Function Tests: No results for input(s): TSH, T4TOTAL, FREET4, T3FREE, THYROIDAB in the last 72 hours. Anemia Panel: No results for input(s): VITAMINB12, FOLATE, FERRITIN, TIBC, IRON, RETICCTPCT in the last 72 hours. Urine analysis:    Component Value Date/Time   COLORURINE YELLOW 06/04/2024 1302   APPEARANCEUR CLOUDY (A) 06/04/2024 1302   LABSPEC 1.022 06/04/2024 1302   PHURINE 6.0 06/04/2024 1302   GLUCOSEU NEGATIVE 06/04/2024 1302   HGBUR SMALL (A) 06/04/2024 1302   BILIRUBINUR SMALL (A) 06/04/2024 1302   KETONESUR 15 (A) 06/04/2024 1302   PROTEINUR 30 (A) 06/04/2024 1302   NITRITE NEGATIVE 06/04/2024 1302   LEUKOCYTESUR LARGE (A) 06/04/2024 1302   Sepsis Labs: @LABRCNTIP (procalcitonin:4,lacticidven:4) )No results found for this or any previous visit (from the past 240 hours).   Radiological Exams on Admission: CT ABDOMEN PELVIS W CONTRAST  Result Date: 06/04/2024 CLINICAL DATA:  Metastatic melanoma.  * Tracking Code: BO * EXAM: CT ABDOMEN AND PELVIS WITH CONTRAST TECHNIQUE: Multidetector CT imaging of the abdomen and pelvis was performed using the standard protocol following bolus administration of intravenous contrast. RADIATION DOSE REDUCTION: This exam was performed according to the departmental dose-optimization program which includes automated exposure control, adjustment of the mA and/or kV according to patient size and/or use of iterative reconstruction technique. CONTRAST:  80mL OMNIPAQUE  IOHEXOL  300 MG/ML  SOLN COMPARISON:  Chest CT 06/04/2024.  PET-CT 12/15/2021. FINDINGS: Lower chest: Atherosclerosis of the aorta and coronary arteries. Calcifications of the aortic valve and mitral annulus. As seen on earlier chest CT, there are small left greater than right pleural effusions without atelectasis or scarring at both lung bases. Hepatobiliary: There are multiple hypoattenuating masses throughout the liver, consistent with widespread metastatic  disease. Largest lesion is located inferiorly in the right lobe, measuring 1.5 cm on image 27/3. Possible gallbladder wall thickening without evidence of gallstones or significant biliary dilatation. Pancreas: Pancreatic atrophy without intrinsic mass lesion, ductal dilatation or surrounding inflammation. Spleen: Normal in size without focal abnormality. Adrenals/Urinary Tract: The adrenal glands are largely obscured by numerous surrounding retroperitoneal nodules. 2.5 x 1.1 cm nodule involving the lateral limb of the left adrenal gland on image 21/3 appears similar in size to the previous PET-CT, possibly an incidental adenoma. No evidence of urinary tract calculus, suspicious renal lesion or hydronephrosis. The bladder appears unremarkable for its degree of distention. Stomach/Bowel: No enteric contrast administered. The stomach appears unremarkable for its degree of distension. No evidence of bowel wall thickening, distention or surrounding inflammatory change. Small bowel extends into a right inguinal hernia and the sigmoid colon extends into a left inguinal hernia. No evidence of incarceration or obstruction. Vascular/Lymphatic: As peripherally image done earlier chest CT, there is new widespread nodularity throughout the lower mediastinum, mesentery and retroperitoneal consistent with widespread metastatic disease. Some of this may be nodal, specially a 2.2 x 2.2 cm left periaortic node on image 19/3. Aortic and branch vessel atherosclerosis without evidence of aneurysm or large vessel occlusion. Reproductive: Uterus and ovaries appear unremarkable. No adnexal mass. Other: As above, widespread metastatic disease with diffuse nodularity throughout the lower mediastinum, retroperitoneum and mesentery. There is omental caking in the left upper quadrant, measuring up to 12.5 x 4.7 cm on image 26/3. There is a moderate amount of pelvic ascites which extends into both inguinal hernias mentioned above.  Musculoskeletal: Multiple subcutaneous nodules are not seen on the previous PET-CT, suspicious for metastatic disease. There is generalized muscular atrophy. Widespread nodular increased density throughout the bone marrow, most notably within the sacrum, consistent with widespread osseous metastatic disease. Associated presacral soft tissue nodule measuring 2.4 cm on image 49/3. Grossly stable chronic superior endplate compression fracture at L2. IMPRESSION: 1. Widespread metastatic disease throughout the lower mediastinum, retroperitoneum, mesentery and subcutaneous tissues. 2. Widespread hepatic and osseous metastatic disease. 3. Moderate amount of pelvic ascites which extends into both inguinal hernias. 4. No evidence of bowel obstruction or perforation. 5.  Aortic Atherosclerosis (ICD10-I70.0). Electronically Signed   By: Elsie Perone M.D.   On: 06/04/2024 16:06   CT Chest Wo Contrast Result Date: 06/04/2024 CLINICAL DATA:  Malignant melanoma. Short of breath. Abnormal chest radiograph. * Tracking Code: BO * EXAM: CT CHEST WITHOUT CONTRAST TECHNIQUE: Multidetector CT imaging of the chest was performed following the standard protocol without IV contrast. RADIATION DOSE REDUCTION: This exam was performed according to the departmental dose-optimization  program which includes automated exposure control, adjustment of the mA and/or kV according to patient size and/or use of iterative reconstruction technique. COMPARISON:  FDG PET scan 12/15/2021 FINDINGS: Cardiovascular: Coronary artery calcification and aortic atherosclerotic calcification. No pericardial effusion Mediastinum/Nodes: Round lymph node in the anterior mediastinum measuring 11 mm on image 67. No supraclavicular adenopathy. Rounded RIGHT sub pectoralis node measures 10 mm on image 41. Lungs/Pleura: Lytic lesion of the posterior lateral RIGHT fifth rib with soft tissue expansion the pleural space. This subpleural expansion measures 21 by 34 mm on  image 38/2. Upper Abdomen: Extensive nodularity within the gastrosplenic ligament (image 108). Extensive nodularity in the pararenal spaces. For example nodule in the superior RIGHT perirenal space measuring 15 mm on image 89/2. Nodularity in the LEFT and RIGHT pericardial fat anterior to the liver and LEFT hemidiaphragm. Musculoskeletal: Multiple healed posterior RIGHT rib fractures. Malignant expansion of the RIGHT lateral fifth rib is described in the chest section. Similar lytic destruction of the posterior LEFT eighth rib measuring 3.1 by 4.1 cm on image 80. IMPRESSION: 1. Extensive nodularity in the upper abdomen consistent with peritoneal and retroperitoneal metastasis. 2. Malignant lytic destruction of the RIGHT fifth and LEFT eighth ribs with soft tissue expansion into the pleural space. 3. Enlarged anterior mediastinal lymph node and RIGHT sub pectoralis lymph node. 4.  Aortic Atherosclerosis (ICD10-I70.0). Electronically Signed   By: Jackquline Boxer M.D.   On: 06/04/2024 13:07   DG Chest 1 View Result Date: 06/04/2024 CLINICAL DATA:  Shortness of breath. EXAM: CHEST  1 VIEW COMPARISON:  10/20/2021 FINDINGS: Cardiopericardial silhouette is at upper limits of normal for size. Left base atelectasis or infiltrate with small left effusion. Tiny right pleural effusion evident. No pulmonary edema or pneumothorax. Focal masslike pleural thickening identified lateral upper right hemithorax, new in the interval with potential destruction of the posterolateral right fifth rib. Telemetry leads overlie the chest. IMPRESSION: 1. New focal masslike pleural thickening lateral upper right hemithorax with potential destruction of the posterolateral right fifth rib. CT chest with contrast recommended to further evaluate. 2. Left base atelectasis or infiltrate with small left and tiny right pleural effusions. Electronically Signed   By: Camellia Candle M.D.   On: 06/04/2024 10:49    EKG: Independently reviewed. Sinus  tachycardia, rate 109, PVCs.   Assessment/Plan   1. Hyponatremia  - Serum sodium 123 on admission without severe symptoms; significant peripheral edema noted  - Check serum osmolality, urine sodium, and urine osmolality, restrict free water intake, and follow serial sodium levels    2. Metastatic disease  - Widespread metastatic disease noted on imaging in ED with unclear primary  - Findings discussed with patient and her daughter/POA - Patient does not want any further investigation or treatment of this; daughter/POA requests Palliative consult to help determine options such as hospice going forward  - Palliative Care consulted     DVT prophylaxis: Lovenox   Code Status: Full, discussed with patient and her daughter on admission  Level of Care: Level of care: Telemetry Family Communication: Daughter/POA at bedside  Disposition Plan:  Patient is from: ILF  Anticipated d/c is to: TBD Anticipated d/c date is: 06/07/24  Patient currently: Pending improved sodium level, Palliative Care consultation, PT eval, disposition planning  Consults called: None  Admission status: Inpatient     Evalene GORMAN Sprinkles, MD [facility]  06/04/2024, 8:24 PM

## 2024-06-04 NOTE — ED Notes (Signed)
 Called [facility] for transport

## 2024-06-04 NOTE — ED Notes (Signed)
 Patient transported to CT

## 2024-06-04 NOTE — ED Notes (Signed)
 ED Provider at bedside.

## 2024-06-04 NOTE — ED Notes (Signed)
 RN reached out to receiving RN. Receiving RN will call back with any questions.

## 2024-06-04 NOTE — ED Triage Notes (Addendum)
 Arrives POV with her daughter.  Patients daughter states that the patient has become short of breath, increasingly weak (difficulty walking), and she also had an abnormal sodium level (134). Patient was referred here for further eval.  Family also reports some cognitive decline at baseline. Patient is in independent living at [facility].

## 2024-06-04 NOTE — ED Notes (Signed)
Attempted report x1. Nurse unavailable  

## 2024-06-04 NOTE — ED Provider Notes (Signed)
 [place] EMERGENCY DEPARTMENT AT [facility] Provider Note   CSN: [id number] Arrival date & time: 06/04/24  9062     Patient presents with: Weakness, Abnormal Lab, and Shortness of Breath   Christina Mcguire is a 83 y.o. female.   83 year old female brought in by daughter for evaluation of shortness of breath and weakness.  Daughter states her heart rates also been in the 120s to the 130s.  Daughter states that patient has not been eating or drinking much and has had a decreased appetite as well.  She states that primary care doctor told him to come to the ER for a sodium of 134.  Patient states she has not felt hungry but denies any other symptoms or concerns at this time.   Weakness Associated symptoms: shortness of breath   Associated symptoms: no abdominal pain, no arthralgias, no chest pain, no cough, no dysuria, no fever, no seizures and no vomiting   Abnormal Lab Shortness of Breath Associated symptoms: no abdominal pain, no chest pain, no cough, no ear pain, no fever, no rash, no sore throat and no vomiting        Prior to Admission medications   Medication Sig Start Date End Date Taking? Authorizing Provider  Apoaequorin (PREVAGEN) 10 MG CAPS Take 10 mg by mouth daily.   Yes [provider]  Multiple Vitamins-Minerals (ADULT GUMMY PO) Take 2 capsules by mouth daily.   Yes [provider]  vitamin C (ASCORBIC ACID) 500 MG tablet Take 500 mg by mouth daily.   Yes [provider]  VITAMIN D  PO Take 1 capsule by mouth daily.   Yes [provider]  COVID-19 mRNA vaccine (SPIKEVAX) syringe Inject 0.5 mLs into the muscle as directed. 05/28/24   Sherlynn Madden, MD    Allergies: Patient has no known allergies.    Review of Systems  Constitutional:  Negative for chills and fever.  HENT:  Negative for ear pain and sore throat.   Eyes:  Negative for pain and visual disturbance.  Respiratory:  Positive for shortness of breath.  Negative for cough.   Cardiovascular:  Negative for chest pain and palpitations.  Gastrointestinal:  Negative for abdominal pain and vomiting.  Genitourinary:  Negative for dysuria and hematuria.  Musculoskeletal:  Negative for arthralgias and back pain.  Skin:  Negative for color change and rash.  Neurological:  Positive for weakness. Negative for seizures and syncope.  All other systems reviewed and are negative.   Updated Vital Signs BP 117/76   Pulse (!) 113   Temp 97.6 F (36.4 C) (Oral)   Resp (!) 27   Ht 5' (1.524 m)   Wt 54.8 kg   SpO2 91%   BMI 23.59 kg/m   Physical Exam Vitals and nursing note reviewed.  Constitutional:      General: She is not in acute distress.    Appearance: She is well-developed. She is not ill-appearing.  HENT:     Head: Normocephalic and atraumatic.  Eyes:     Conjunctiva/sclera: Conjunctivae normal.  Cardiovascular:     Rate and Rhythm: Regular rhythm. Tachycardia present.     Heart sounds: No murmur heard. Pulmonary:     Effort: Pulmonary effort is normal. No respiratory distress.     Breath sounds: Normal breath sounds.  Abdominal:     Palpations: Abdomen is soft.     Tenderness: There is no abdominal tenderness.  Musculoskeletal:        General: No swelling.  Cervical back: Neck supple.  Skin:    General: Skin is warm and dry.     Capillary Refill: Capillary refill takes less than 2 seconds.  Neurological:     Mental Status: She is alert.  Psychiatric:        Mood and Affect: Mood normal.     (all labs ordered are listed, but only abnormal results are displayed) Labs Reviewed  BASIC METABOLIC PANEL WITH GFR - Abnormal; Notable for the following components:      Result Value   Sodium 123 (*)    Chloride 87 (*)    CO2 18 (*)    Anion gap 18 (*)    All other components within normal limits  CBC WITH DIFFERENTIAL/PLATELET - Abnormal; Notable for the following components:   WBC 12.4 (*)    RBC 3.74 (*)    Hemoglobin  11.3 (*)    HCT 33.6 (*)    Neutro Abs 9.4 (*)    Abs Immature Granulocytes 0.48 (*)    All other components within normal limits  HEPATIC FUNCTION PANEL - Abnormal; Notable for the following components:   Total Protein 5.4 (*)    Albumin 3.0 (*)    Alkaline Phosphatase 150 (*)    Bilirubin, Direct 0.4 (*)    All other components within normal limits  URINALYSIS, ROUTINE W REFLEX MICROSCOPIC - Abnormal; Notable for the following components:   APPearance CLOUDY (*)    Hgb urine dipstick SMALL (*)    Bilirubin Urine SMALL (*)    Ketones, ur 15 (*)    Protein, ur 30 (*)    Leukocytes,Ua LARGE (*)    Bacteria, UA MANY (*)    All other components within normal limits  TROPONIN T, HIGH SENSITIVITY - Abnormal; Notable for the following components:   Troponin T High Sensitivity 34 (*)    All other components within normal limits  TROPONIN T, HIGH SENSITIVITY - Abnormal; Notable for the following components:   Troponin T High Sensitivity 33 (*)    All other components within normal limits  LIPASE, BLOOD    EKG: EKG Interpretation Date/Time:  Tuesday June 04 2024 09:56:04 EDT Ventricular Rate:  109 PR Interval:  170 QRS Duration:  94 QT Interval:  331 QTC Calculation: 446 R Axis:   40  Text Interpretation: Sinus tachycardia Multiform ventricular premature complexes No previous for comparison Confirmed by Gennaro Bouchard ([id number]) on 06/04/2024 10:00:26 AM  Radiology: CT Chest Wo Contrast Result Date: 06/04/2024 CLINICAL DATA:  Malignant melanoma. Short of breath. Abnormal chest radiograph. * Tracking Code: BO * EXAM: CT CHEST WITHOUT CONTRAST TECHNIQUE: Multidetector CT imaging of the chest was performed following the standard protocol without IV contrast. RADIATION DOSE REDUCTION: This exam was performed according to the departmental dose-optimization program which includes automated exposure control, adjustment of the mA and/or kV according to patient size and/or use of  iterative reconstruction technique. COMPARISON:  FDG PET scan 12/15/2021 FINDINGS: Cardiovascular: Coronary artery calcification and aortic atherosclerotic calcification. No pericardial effusion Mediastinum/Nodes: Round lymph node in the anterior mediastinum measuring 11 mm on image 67. No supraclavicular adenopathy. Rounded RIGHT sub pectoralis node measures 10 mm on image 41. Lungs/Pleura: Lytic lesion of the posterior lateral RIGHT fifth rib with soft tissue expansion the pleural space. This subpleural expansion measures 21 by 34 mm on image 38/2. Upper Abdomen: Extensive nodularity within the gastrosplenic ligament (image 108). Extensive nodularity in the pararenal spaces. For example nodule in the superior RIGHT perirenal space measuring 15  mm on image 89/2. Nodularity in the LEFT and RIGHT pericardial fat anterior to the liver and LEFT hemidiaphragm. Musculoskeletal: Multiple healed posterior RIGHT rib fractures. Malignant expansion of the RIGHT lateral fifth rib is described in the chest section. Similar lytic destruction of the posterior LEFT eighth rib measuring 3.1 by 4.1 cm on image 80. IMPRESSION: 1. Extensive nodularity in the upper abdomen consistent with peritoneal and retroperitoneal metastasis. 2. Malignant lytic destruction of the RIGHT fifth and LEFT eighth ribs with soft tissue expansion into the pleural space. 3. Enlarged anterior mediastinal lymph node and RIGHT sub pectoralis lymph node. 4.  Aortic Atherosclerosis (ICD10-I70.0). Electronically Signed   By: Jackquline Boxer M.D.   On: 06/04/2024 13:07   DG Chest 1 View Result Date: 06/04/2024 CLINICAL DATA:  Shortness of breath. EXAM: CHEST  1 VIEW COMPARISON:  10/20/2021 FINDINGS: Cardiopericardial silhouette is at upper limits of normal for size. Left base atelectasis or infiltrate with small left effusion. Tiny right pleural effusion evident. No pulmonary edema or pneumothorax. Focal masslike pleural thickening identified lateral upper  right hemithorax, new in the interval with potential destruction of the posterolateral right fifth rib. Telemetry leads overlie the chest. IMPRESSION: 1. New focal masslike pleural thickening lateral upper right hemithorax with potential destruction of the posterolateral right fifth rib. CT chest with contrast recommended to further evaluate. 2. Left base atelectasis or infiltrate with small left and tiny right pleural effusions. Electronically Signed   By: Camellia Candle M.D.   On: 06/04/2024 10:49     Procedures   Medications Ordered in the ED  sodium chloride  0.9 % bolus 500 mL (0 mLs Intravenous Stopped 06/04/24 1227)  iohexol  (OMNIPAQUE ) 300 MG/ML solution 100 mL (80 mLs Intravenous Contrast Given 06/04/24 1525)                                    Medical Decision Making Cardiac monitor interpretation: Sinus tachycardia, no ectopy  Social determinants of health: Patient has a history of dementia, lives in a nursing facility  Patient's lab workup reviewed by me she has hyponatremia in the 120s.  Was given IV fluids and tachycardia did somewhat improved.  Also found to have new malignancy on her CT scans with metastatic disease to the abdomen.  Discussed patient's case with Dr. Raenelle, patient will be admitted for further workup and management.  All results and plan discussed with patient and family at bedside and they are agreeable with the plan.  Problems Addressed: General weakness: undiagnosed new problem with uncertain prognosis Hyponatremia: acute illness or injury that poses a threat to life or bodily functions Malignant neoplasm of lung, unspecified laterality, unspecified part of lung (HCC): undiagnosed new problem with uncertain prognosis Metastatic malignant neoplasm, unspecified site [facility]): undiagnosed new problem with uncertain prognosis Tachycardia: undiagnosed new problem with uncertain prognosis  Amount and/or Complexity of Data Reviewed Independent Historian: caregiver     Details: Daughter at bedside helps provide history regarding the patient's case and that patient has not been eating or drinking lately External Data Reviewed: notes.    Details: Outside records reviewed by me patient has no history of hyponatremia Labs: ordered. Decision-making details documented in ED Course.    Details: Ordered and reviewed by me and patient with hyponatremia Radiology: ordered and independent interpretation performed. Decision-making details documented in ED Course.    Details: Ordered and reviewed by me and patient found to have new chest  and abdomen/pelvis malignancy on CT of chest  Chest x-ray was ordered and interpreted by me independently of radiology and shows lung mass ECG/medicine tests: ordered and independent interpretation performed. Decision-making details documented in ED Course.    Details: Ordered and interpreted by me in the absence of cardiology and shows sinus tachycardia, no STEMI Discussion of management or test interpretation with external provider(s): Dr. Raenelle -hospitalist-I spoke with him regarding patient's case and she will be admitted for further workup and management  Risk OTC drugs. Prescription drug management. Drug therapy requiring intensive monitoring for toxicity. Decision regarding hospitalization. Diagnosis or treatment significantly limited by social determinants of health.     Final diagnoses:  Hyponatremia  Malignant neoplasm of lung, unspecified laterality, unspecified part of lung (HCC)  Metastatic malignant neoplasm, unspecified site [facility])  Tachycardia  General weakness    ED Discharge Orders     None          Gennaro Duwaine CROME, DO 06/04/24 1539

## 2024-06-05 ENCOUNTER — Ambulatory Visit: Admitting: Sports Medicine

## 2024-06-05 ENCOUNTER — Inpatient Hospital Stay (HOSPITAL_COMMUNITY)

## 2024-06-05 DIAGNOSIS — R531 Weakness: Secondary | ICD-10-CM

## 2024-06-05 DIAGNOSIS — R609 Edema, unspecified: Secondary | ICD-10-CM

## 2024-06-05 DIAGNOSIS — E871 Hypo-osmolality and hyponatremia: Secondary | ICD-10-CM | POA: Diagnosis not present

## 2024-06-05 DIAGNOSIS — C3490 Malignant neoplasm of unspecified part of unspecified bronchus or lung: Secondary | ICD-10-CM

## 2024-06-05 DIAGNOSIS — Z515 Encounter for palliative care: Secondary | ICD-10-CM

## 2024-06-05 LAB — CBC
HCT: 33 % — ABNORMAL LOW (ref 36.0–46.0)
Hemoglobin: 10.6 g/dL — ABNORMAL LOW (ref 12.0–15.0)
MCH: 29.9 pg (ref 26.0–34.0)
MCHC: 32.1 g/dL (ref 30.0–36.0)
MCV: 93 fL (ref 80.0–100.0)
Platelets: 345 K/uL (ref 150–400)
RBC: 3.55 MIL/uL — ABNORMAL LOW (ref 3.87–5.11)
RDW: 14 % (ref 11.5–15.5)
WBC: 12.9 K/uL — ABNORMAL HIGH (ref 4.0–10.5)
nRBC: 0 % (ref 0.0–0.2)

## 2024-06-05 LAB — BASIC METABOLIC PANEL WITH GFR
Anion gap: 17 — ABNORMAL HIGH (ref 5–15)
BUN: 13 mg/dL (ref 8–23)
CO2: 20 mmol/L — ABNORMAL LOW (ref 22–32)
Calcium: 9.6 mg/dL (ref 8.9–10.3)
Chloride: 91 mmol/L — ABNORMAL LOW (ref 98–111)
Creatinine, Ser: 0.46 mg/dL (ref 0.44–1.00)
GFR, Estimated: >60 mL/min (ref >60)
Glucose, Bld: 107 mg/dL — ABNORMAL HIGH (ref 70–99)
Potassium: 3.9 mmol/L (ref 3.5–5.1)
Sodium: 128 mmol/L — ABNORMAL LOW (ref 135–145)

## 2024-06-05 LAB — OSMOLALITY: Osmolality: 271 mosm/kg — ABNORMAL LOW (ref 275–295)

## 2024-06-05 LAB — SODIUM: Sodium: 125 mmol/L — ABNORMAL LOW (ref 135–145)

## 2024-06-05 LAB — MAGNESIUM: Magnesium: 1.9 mg/dL (ref 1.7–2.4)

## 2024-06-05 LAB — OSMOLALITY, URINE: Osmolality, Ur: 570 mosm/kg (ref 300–900)

## 2024-06-05 LAB — SODIUM, URINE, RANDOM: Sodium, Ur: <30 mmol/L

## 2024-06-05 MED ORDER — ENSURE PLUS HIGH PROTEIN PO LIQD
237.0000 mL | Freq: Two times a day (BID) | ORAL | Status: DC
  Administered 2024-06-05 – 2024-06-06 (×2): 237 mL via ORAL

## 2024-06-05 MED ORDER — ORAL CARE MOUTH RINSE: 15.0000 mL | OROMUCOSAL | Status: DC | PRN

## 2024-06-05 NOTE — TOC Initial Note (Signed)
 Transition of Care [facility]) - Initial/Assessment Note    Patient Details  Name: Christina Mcguire MRN: [id number] Date of Birth: 08-Feb-1941  [facility]) CM/SW Contact:    Bascom Service, RN Phone Number: 06/05/2024, 9:49 AM  Clinical Narrative: Noted patient w/some memory impairment. Spoke to [employer]) From [facility]-Indep living;has shower chair,rw;[facility] adl's, ambulation. Christina Mcguire does not want return back to [facility];await Palliative cons, & PT cons & recc.                  Expected Discharge Plan: Home w Home Health Services Barriers to Discharge: Continued Medical Work up   Patient Goals and CMS Choice Patient states their goals for this hospitalization and ongoing recovery are:: No return back to [employer] Medicare.gov Compare Post Acute Care list provided to:: Patient Represenative (must comment) (Lynn(dtr)) Choice offered to / list presented to : Adult Children  ownership interest in [facility].provided to:: Adult Children    Expected Discharge Plan and Services   Discharge Planning Services: CM Consult Post Acute Care Choice:  (TBD) Living arrangements for the past 2 months: Independent Living Facility                                      Prior Living Arrangements/Services Living arrangements for the past 2 months: Independent Living Facility Lives with:: Facility Resident                   Activities of Daily Living   ADL Screening (condition at time of admission) Independently performs ADLs?: Yes (appropriate for developmental age) Is the patient deaf or have difficulty hearing?: No Does the patient have difficulty seeing, even when wearing glasses/contacts?: No Does the patient have difficulty concentrating, remembering, or making decisions?: Yes  Permission Sought/Granted                  Emotional Assessment              Admission diagnosis:  Hyponatremia [E87.1] Tachycardia [R00.0] General  weakness [R53.1] Malignant neoplasm of lung, unspecified laterality, unspecified part of lung (HCC) [C34.90] Metastatic malignant neoplasm, unspecified site [facility]) [C79.9] Patient Active Problem List   Diagnosis Date Noted   Hyponatremia 06/04/2024   Metastatic disease (HCC) 06/04/2024   Mass of nose 05/25/2022   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 [place] [address] Phone: [phone] Fax: [phone]     Social Drivers of Health (SDOH) Social History: SDOH Screenings   Food Insecurity: No Food Insecurity (06/04/2024)  Housing: Low Risk  (06/04/2024)  Transportation Needs: No Transportation Needs (06/04/2024)  Utilities: Not At Risk (06/04/2024)  Alcohol Screen: Low Risk  (05/27/2024)  Depression (PHQ2-9): Low Risk  (05/28/2024)  Financial Resource Strain: Low Risk  (05/27/2024)  Physical Activity: Inactive (05/27/2024)  Social Connections: Moderately Integrated (06/04/2024)  Stress: No Stress Concern Present (05/27/2024)  Tobacco Use: Low Risk  (06/04/2024)   SDOH Interventions:  Readmission Risk Interventions     No data to display

## 2024-06-05 NOTE — Evaluation (Signed)
 Physical Therapy Evaluation Patient Details Name: Christina Mcguire MRN: [id number] DOB: 1941/04/25 Today's Date: 06/05/2024  History of Present Illness  83 yo female admitted with hyponatremia, dyspnea, weakness. Hx of memory loss, breast Ca, osteoporosis, widespread metastatic disease  Clinical Impression  On eval, pt required Min A for mobility. She was able to stand and perform step pivot transfers, bed<>bsc, with RW. Total A for toileting hygiene and donning brief (daughter request). Assisted pt back to bed.  Ambulation deferred due to ultrasound tech waiting to perform test. Pt presents with general weakness, decreased activity tolerance, and impaired gait and balance. HR up to 140s bpm, O2 92% on RA during session. Will plan to follow pt during this hospital stay. Plan is for d/c home with family, with hospice, once medically ready.        If plan is discharge home, recommend the following: A little help with walking and/or transfers;A little help with bathing/dressing/bathroom;Assistance with cooking/housework;Assist for transportation;Help with stairs or ramp for entrance   Can travel by private vehicle        Equipment Recommendations Hospital bed;Rolling walker (2 wheels) (youth height)  Recommendations for Other Services       Functional Status Assessment Patient has had a recent decline in their functional status and/or demonstrates limited ability to make significant improvements in function in a reasonable and predictable amount of time     Precautions / Restrictions Precautions Precautions: Fall Precaution/Restrictions Comments: incontinent Restrictions Weight Bearing Restrictions Per Provider Order: No      Mobility  Bed Mobility Overal bed mobility: Needs Assistance Bed Mobility: Supine to Sit, Sit to Supine     Supine to sit: HOB elevated, Used rails, Contact guard Sit to supine: HOB elevated, Used rails, Min assist   General bed mobility comments: Assist for LEs  back onto bed. Increased time overall for bed mobility. Cues provided as needed.    Transfers Overall transfer level: Needs assistance Equipment used: Rolling walker (2 wheels) Transfers: Sit to/from Stand, Bed to chair/wheelchair/BSC Sit to Stand: Min assist   Step pivot transfers: Min assist       General transfer comment: Assist to rise, steady, control descent. Cues for safety, hand placement. Increased time. HR up to 146 bpm, O2 92% on RA.    Ambulation/Gait               General Gait Details: NT on today-ultrasound tech waiting to assess patient so deferred  Stairs            Wheelchair Mobility     Tilt Bed    Modified Rankin (Stroke Patients Only)       Balance Overall balance assessment: Needs assistance, History of Falls         Standing balance support: Bilateral upper extremity supported, During functional activity, Reliant on assistive device for balance Standing balance-Leahy Scale: Poor                               Pertinent Vitals/Pain Pain Assessment Pain Assessment: No/denies pain    Home Living Family/patient expects to be discharged to:: Private residence Living Arrangements: Children Available Help at Discharge: Family;Available 24 hours/day Type of Home: House Home Access: Stairs to enter;Ramped entrance Entrance Stairs-Rails: Right Entrance Stairs-Number of Steps: 3   Home Layout: Able to live on main level with bedroom/bathroom Home Equipment: BSC/3in1;Shower seat (3 wheeled walker)      Prior Function Prior Level of  Function : Independent/Modified Independent             Mobility Comments: uses 3 wheeled walker       Extremity/Trunk Assessment   Upper Extremity Assessment Upper Extremity Assessment: Generalized weakness    Lower Extremity Assessment Lower Extremity Assessment: Generalized weakness    Cervical / Trunk Assessment Cervical / Trunk Assessment: Normal  Communication    Communication Communication: No apparent difficulties    Cognition Arousal: Alert Behavior During Therapy: WFL for tasks assessed/performed   PT - Cognitive impairments: History of cognitive impairments                       PT - Cognition Comments: has been having some recent cognitive deficits per daughter Following commands: Intact       Cueing Cueing Techniques: Verbal cues, Visual cues, Tactile cues     General Comments      Exercises     Assessment/Plan    PT Assessment Patient needs continued PT services  PT Problem List Decreased strength;Decreased activity tolerance;Decreased balance;Decreased mobility;Decreased knowledge of use of DME       PT Treatment Interventions DME instruction;Gait training;Functional mobility training;Therapeutic activities;Therapeutic exercise;Patient/family education;Balance training    PT Goals (Current goals can be found in the Care Plan section)  Acute Rehab PT Goals Patient Stated Goal: home with family PT Goal Formulation: With patient/family Time For Goal Achievement: 06/19/24 Potential to Achieve Goals: Fair    Frequency Min 3X/week     Co-evaluation               AM-PAC PT 6 Clicks Mobility  Outcome Measure Help needed turning from your back to your side while in a flat bed without using bedrails?: A Little Help needed moving from lying on your back to sitting on the side of a flat bed without using bedrails?: A Little Help needed moving to and from a bed to a chair (including a wheelchair)?: A Little Help needed standing up from a chair using your arms (e.g., wheelchair or bedside chair)?: A Little Help needed to walk in hospital room?: A Little Help needed climbing 3-5 steps with a railing? : A Lot 6 Click Score: 17    End of Session Equipment Utilized During Treatment: Gait belt Activity Tolerance: Patient tolerated treatment well Patient left: in bed;with bed alarm set;with family/visitor  present   PT Visit Diagnosis: Muscle weakness (generalized) (M62.81);Difficulty in walking, not elsewhere classified (R26.2)    Time: 8895-8874 PT Time Calculation (min) (ACUTE ONLY): 21 min   Charges:   PT Evaluation $PT Eval Low Complexity: 1 Low   PT General Charges $$ ACUTE PT VISIT: 1 Visit           Dannial SQUIBB, PT Acute Rehabilitation  Office: [phone]

## 2024-06-05 NOTE — TOC Progression Note (Signed)
 Transition of Care [facility]) - Progression Note    Patient Details  Name: Christina Mcguire MRN: [id number] Date of Birth: 1941-04-12  [facility] CM/SW Contact  Shandale Malak, Nathanel, RN Phone Number: 06/05/2024, 11:47 AM  Clinical Narrative: Referral for home w/hospice-[facility] chosen by Lynn(dtr) rep for [facility] Melissa to eval-await outcome.dme needed-3n1. Has own transport home.      Expected Discharge Plan: Home w Hospice Care Barriers to Discharge: Continued Medical Work up               Expected Discharge Plan and Services   Discharge Planning Services: CM Consult Post Acute Care Choice: Hospice Living arrangements for the past 2 months: Single Family Home                                       Social Drivers of Health (SDOH) Interventions SDOH Screenings   Food Insecurity: No Food Insecurity (06/04/2024)  Housing: Low Risk  (06/04/2024)  Transportation Needs: No Transportation Needs (06/04/2024)  Utilities: Not At Risk (06/04/2024)  Alcohol Screen: Low Risk  (05/27/2024)  Depression (PHQ2-9): Low Risk  (05/28/2024)  Financial Resource Strain: Low Risk  (05/27/2024)  Physical Activity: Inactive (05/27/2024)  Social Connections: Moderately Integrated (06/04/2024)  Stress: No Stress Concern Present (05/27/2024)  Tobacco Use: Low Risk  (06/04/2024)    Readmission Risk Interventions     No data to display

## 2024-06-05 NOTE — Progress Notes (Signed)
 [id number] Liaison Note  Received request from [facility], [name], for hospice services at home after discharge.  Spoke with daughter, [name] to initiate education related to hospice philosophy, services, and team approach to care.  Family verbalized understanding of information given.  Per discussion, the plan for discharge home by private vehicle on Thursday 9.18.  DME needs discussed.  Patient does not have DME in the home.  Family requests the following equipment for delivery: none at this time.. The address has been verified and is correct in the chart. [name] at [phone] is the family contact to arrange time of equipment delivery.  Please send signed and completed DNR home with patient/family.  Please provide prescriptions at discharge as needed to ensure ongoing symptom management.  [facility] information and contact numbers have been given to [place].  Above information share with [name], [facility] manager.  Please call with any questions or concerns.  Thank you for the opportunity to participate in this patient's care.  Inocente Jacobs BSN [employer] [phone]

## 2024-06-05 NOTE — Consult Note (Signed)
 Consultation Note Date: 06/05/2024   Patient Name: Christina Mcguire  DOB: February 20, 1941  MRN: [id number]  Age / Sex: 83 y.o., female  PCP: Sherlynn Madden, MD Referring Physician: Sonjia Held, MD  Reason for Consultation: Establishing goals of care  HPI/Patient Profile: 83 y.o. 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   PMT has been consulted to assist with goals of care conversation. Patient/Family face treatment option decisions, advanced directive decisions and anticipatory care needs.   Family face treatment option decision, advance directive decisions and anticipatory care needs.     Goals of care: Broad aims of medical therapy in relation to the patient's values and preferences. Our aim is to provide medical care aimed at enabling patients to achieve the goals that matter most to them, given the circumstances of their particular medical situation and their constraints.    Clinical Assessment and Goals of Care:  We have reviewed medical records including EPIC notes, labs and imaging, assessed the patient and then met with patient herself and her daughter Christina Mcguire who is also her HCPOA to discuss diagnosis prognosis, GOC, EOL wishes, disposition and options.  We introduced Palliative Medicine as specialized medical care for people living with serious illness. It focuses on providing relief from the symptoms and stress of a serious illness. The  goal is to improve quality of life for both the patient and the family.  Created space and opportunity for patient and her daughter to explore thoughts and feelings regarding patient's current medical condition.   We visited the patient at her bedside today, her daughter, Christina was also at bedside. The patient appears frail but is not exhibiting any signs of acute distress. She remains alert, oriented, and able to engage in appropriate conversation.   Christina shared that her mother has experienced a steady functional decline since 04/29/24, with more pronounced changes over the past month. Previously mostly independent, the patient has become progressively weaker, with noticeable fatigue, reduced appetite, and shortness of breath even with minimal exertion.  Both the patient and her daughter demonstrated a clear and reasonable understanding of the current hospitalization and underlying illness. Christina noted and as shared by the admitting provider yesterday, recent imaging revealed widespread metastatic disease. As documented in the H&P and confirmed during our discussion today, they have chosen not to pursue further diagnostic workup or treatment, preferring a comfort-focused approach.  We discussed goals of care and code status with both the patient and her daughter. Given the extent of disease and the risks associated with full resuscitative efforts, the patient elected for DNR-comfort status. Throughout the conversation, it was evident that quality of life is their primary concern.  We introduced the hospice philosophy of care, emphasizing its role in symptom management and support aligned with the patient's values and goals. Both the patient and her daughter expressed interest in pursuing hospice services at home. The plan is for the patient to be discharged to [facility]'s residence with home hospice care, specifically requesting services through [facility].  Discussed the importance of continued conversation  with family and the medical providers regarding overall plan of care and treatment options, ensuring decisions are within the context of the patient's values and GOCs.   Questions and concerns were addressed. The family was encouraged to call with questions or concerns.  PMT will continue to support holistically.    Medical History Review and Family/Patient Understanding:  As stated above, both patient and daughter has demonstrated good and reasonable understanding of patient's current medical condition and prognosis, and has opted for comfort-focused approach, with plan for home with hospice.   Social History: The patient has been residing at [facility], an independent living community, for approximately four years following [place]. Her husband of 57 years passed away in 04/24/24. She has two children, with her daughter Christina serving as her primary caregiver and healthcare power of attorney. The patient is a retired Engineer, civil (consulting) who enjoys engaging in social activities and spending time reading.  Functional and Nutritional State: At baseline she is ambulatory with the use of a walker. Noted however for increasing weakness with accompanying shortness of breath and loss of appetite since 2024-04-24.   Palliative Symptoms: Generalized weakness, shortness of breath, loss of appetite, weight loss.  Advance Directives: She has an existing advance directives. Christina Mcguire is her daughter and HCPOA.  Brief life review:  The patient is a retired Charity fundraiser, [place], she has 2 daughters, one daughter Christina is Product manager and another daughter lives in [place] .   Code Status: DNR-Comfort  Primary Decision Maker: Patient, Daughter Christina Mcguire is her HCPOA.    SUMMARY OF RECOMMENDATIONS    Code Status: Changed from full code to DNR-Comfort Patient opted for comfort-focused care, no aggressive medical interventions or escalation in level of care. TOC  referral placed for home with hospice. Family preferred [facility]. Continue to provide psycho-social and emotional support to patient and family Palliative medicine team will continue to follow.   Symptom Management: Per Primary team Palliative medicine is available to assist as needed.    Palliative Prophylaxis:  Aspiration, Delirium Protocol, Frequent Pain Assessment, Oral Care, and Turn Reposition   Prognosis:  Prognosis is relatively poor given the progressive functional decline, advanced age, and the widespread metastatic disease. Less than 6 months.   Discharge Planning: Home with Hospice      Primary Diagnoses: Present on Admission:  Hyponatremia  Metastatic disease (HCC)    Physical Exam Vitals and nursing note reviewed.  Constitutional:      Appearance: She is ill-appearing.  HENT:     Head: Normocephalic and atraumatic.  Cardiovascular:     Rate and Rhythm: Normal rate.  Pulmonary:     Effort: Pulmonary effort is normal.  Abdominal:     Palpations: Abdomen is soft.  Musculoskeletal:     Comments: Generalized weakness.   Skin:    General: Skin is warm.  Neurological:     General: No focal deficit present.     Mental Status: She is alert and oriented to person, place, and time.     Vital Signs: BP 109/60 (BP Location: Left Arm)   Pulse (!) 108   Temp 98.2 F (36.8 C) (Oral)   Resp 18   Ht 5' (1.524 m)   Wt 54.8 kg   SpO2 93%   BMI 23.59 kg/m  Pain Scale: 0-10   Pain Score: 0-No pain   SpO2: SpO2: 93 % O2 Device:SpO2: 93 % O2 Flow Rate: .O2  Flow Rate (L/min): 2 L/min   Palliative Assessment/Data:50%    Total time: I spent 90 minutes in the care of the patient today in the above activities and documenting the encounter.   Lonia Serve MD. This patient was seen alongside palliative NP Kathlyne Bolder.   Palliative Medicine Team Team phone # [phone]  Thank you for allowing the Palliative Medicine Team to assist in the care of  this patient. Please utilize secure chat with additional questions, if there is no response within 30 minutes please call the above phone number.  Palliative Medicine Team providers are available by phone from 7am to 7pm daily and can be reached through the team cell phone.  Should this patient require assistance outside of these hours, please call the patient's attending physician.

## 2024-06-05 NOTE — Hospital Course (Signed)
 Mesa Janus is an 83 y.o. 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  Patient also had shortness of breath with activity and some lower extremity edema.  In the ED patient was afebrile, saturating low 90s in room air with tachycardia and blood pressure stable.  Initial labs showed sodium low at 123, WBC slightly elevated at 12.4.  Troponin was 34 followed by 33.  CT scan of the chest was concerning for metastatic disease throughout the lower mediastinum, retroperitoneum, mesentery, subcutaneous tissues, and widespread hepatic and osseous metastasis with pathologic rib fractures.  Patient was then considered for admission to the hospital for further evaluation and treatment.   Hyponatremia  Had sodium of 123 on presentation with peripheral edema.  Sodium today at 125.  Urinary sodium less than 30.  Serum osmolality low at 271.  Possibility of SIADH.  Continue fluid restriction.  Was drinking a lot of water at home.    Metastatic disease  CT scan with metastatic disease.  Patient does not wish to proceed with further investigation at this time.  Admitting provider also spoke with the patient and her daughter who is the power of attorney.  Palliative care was decided.  Will follow palliative care recommendations in terms of possible options including hospice.  Failure to thrive weight loss poor oral intake likely secondary to metastatic cancer.  Supportive care.  Abnormal urinalysis.  No urinary symptoms.  Likely asymptomatic bacteriuria.

## 2024-06-05 NOTE — Progress Notes (Signed)
 PROGRESS NOTE  Christina Mcguire FMW:[id number] DOB: [birth year] DOA: 06/04/2024 PCP: Sherlynn Madden, MD   LOS: 1 day   Brief narrative:  Christina Mcguire is an 83 y.o. 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  Patient also had shortness of breath with activity and some lower extremity edema.  In the ED patient was afebrile, saturating low 90s in room air with tachycardia and blood pressure stable.  Initial labs showed sodium low at 123, WBC slightly elevated at 12.4.  Troponin was 34 followed by 33.  CT scan of the chest was concerning for metastatic disease throughout the lower mediastinum, retroperitoneum, mesentery, subcutaneous tissues, and widespread hepatic and osseous metastasis with pathologic rib fractures.  Patient was then considered for admission to the hospital for further evaluation and treatment.    Assessment/Plan: Principal Problem:   Hyponatremia Active Problems:   Metastatic disease (HCC)  Hyponatremia  Had sodium of 123 on presentation with peripheral edema.  Sodium today at 125.  Urinary sodium less than 30.  Serum osmolality low at 271.  Possibility of SIADH.  Continue fluid restriction.  Was drinking a lot of water at home.  Will monitor BMP.    Metastatic disease  CT scan with widespread metastatic disease.  Patient does not wish to proceed with further investigation at this time.  I again had a long conversation with the patient as well as patient's daughter at bedside.  Palliative care has been consulted for discerning further goals of care including hospice care.  Mild shortness of breath.  On supplemental  oxygen.  Irregular rhythm.  EKG showed sinus tachycardia.  Failure to thrive weight loss poor oral intake likely secondary to metastatic cancer.  Supportive care.  Abnormal urinalysis.  No urinary symptoms.  Likely asymptomatic bacteriuria.  No indication for antibiotic.  DVT prophylaxis: enoxaparin  (LOVENOX ) injection 40 mg Start: 06/04/24 2200  Disposition: Likely home with hospice in 1 to 2 days.  Status is: Inpatient Remains inpatient appropriate because: Metastatic disease hyponatremia pending clinical improvement, possible transition to hospice    Code Status:     Code Status: Do not attempt resuscitation (DNR) - Comfort care  Family Communication: Spoke with the patient's daughter at bedside on 06/05/2024  Consultants: Palliative care  Procedures: None  Anti-infectives:  None  Anti-infectives (From admission, onward)    None       Subjective: Today, patient was seen and examined at bedside.  Patient denies any pain, nausea, vomiting, fever, chills but has some cough and shortness of breath.  Patient's daughter at bedside.  Objective: Vitals:   06/05/24 0916 06/05/24 1106  BP: 109/60   Pulse: (!) 108   Resp: 18   Temp: 98.2 F (36.8 C)   SpO2: 96% 93%    Intake/Output Summary (Last 24 hours) at 06/05/2024 1313 Last data filed at 06/05/2024 0854 Gross per 24 hour  Intake 123 ml  Output --  Net 123 ml   Filed Weights   06/04/24 0956  Weight: 54.8 kg   Body mass index is 23.59 kg/m.   Physical Exam:  GENERAL: Patient is alert awake and oriented. Not in obvious distress.  Elderly female, thinly  built, Communicative, HENT: No scleral pallor or icterus. Pupils equally reactive to light. Oral mucosa is moist NECK: is supple, no gross swelling noted. CHEST: Diminished breath sounds bilaterally. CVS: S1 and S2 heard, tachycardic but irregular heart rate..  ABDOMEN: Soft, non-tender, bowel sounds are present. EXTREMITIES: No edema. CNS: Cranial  nerves are intact.  Moves all extremities. SKIN: warm and dry without rashes.  Data Review: I have personally reviewed the following laboratory data and studies,  CBC: Recent Labs  Lab 06/04/24 1054 06/05/24 0137  WBC 12.4* 12.9*  NEUTROABS 9.4*  --   HGB 11.3* 10.6*  HCT 33.6* 33.0*  MCV 89.8 93.0  PLT 320 345   Basic Metabolic Panel: Recent Labs  Lab 06/04/24 1054 06/04/24 2135 06/05/24 0143 06/05/24 0822  NA 123* 125* 125* 128*  K 3.6  --   --  3.9  CL 87*  --   --  91*  CO2 18*  --   --  20*  GLUCOSE 95  --   --  107*  BUN 15  --   --  13  CREATININE 0.46  --   --  0.46  CALCIUM 9.4  --   --  9.6  MG  --   --   --  1.9   Liver Function Tests: Recent Labs  Lab 06/04/24 1054  AST 37  ALT 8  ALKPHOS 150*  BILITOT 0.9  PROT 5.4*  ALBUMIN 3.0*   Recent Labs  Lab 06/04/24 1054  LIPASE 28   No results for input(s): AMMONIA in the last 168 hours. Cardiac Enzymes: No results for input(s): CKTOTAL, CKMB, CKMBINDEX, TROPONINI in the last 168 hours. BNP (last 3 results) No results for input(s): BNP in the last 8760 hours.  ProBNP (last 3 results) No results for input(s): PROBNP in the last 8760 hours.  CBG: No results for input(s): GLUCAP in the last 168 hours. No results found for this or any previous visit (from the past 240 hours).   Studies: VAS US  LOWER EXTREMITY VENOUS (DVT) Result Date: 06/05/2024  Lower Venous DVT Study Patient Name:  Christina Mcguire  Date of Exam:   06/05/2024 Medical Rec #: [id number]    Accession #:    [id number] Date of Birth: 1941/02/11     Patient Gender: F Patient Age:   62 years Exam Location:  [facility] Procedure:      VAS US  LOWER EXTREMITY VENOUS (DVT) Referring Phys: TIMOTHY OPYD --------------------------------------------------------------------------------  Indications: Edema.  Risk Factors: None identified. Limitations: Poor ultrasound/tissue interface. Comparison Study: No prior studies. Performing  Technologist: Cordella Collet RVT  Examination Guidelines: A complete evaluation includes B-mode imaging, spectral Doppler, color Doppler, and power Doppler as needed of all accessible portions of each vessel. Bilateral testing is considered an integral part of a complete examination. Limited examinations for reoccurring indications may be performed as noted. The reflux portion of the exam is performed with the patient in reverse Trendelenburg.  +---------+---------------+---------+-----------+----------+--------------+ RIGHT    CompressibilityPhasicitySpontaneityPropertiesThrombus Aging +---------+---------------+---------+-----------+----------+--------------+ CFV      Full           Yes      Yes                                 +---------+---------------+---------+-----------+----------+--------------+ SFJ      Full                                                        +---------+---------------+---------+-----------+----------+--------------+  FV Prox  Full                                                        +---------+---------------+---------+-----------+----------+--------------+ FV Mid   Full                                                        +---------+---------------+---------+-----------+----------+--------------+ FV DistalFull                                                        +---------+---------------+---------+-----------+----------+--------------+ PFV      Full                                                        +---------+---------------+---------+-----------+----------+--------------+ POP      Full           Yes      Yes                                 +---------+---------------+---------+-----------+----------+--------------+ PTV      Full                                                        +---------+---------------+---------+-----------+----------+--------------+ PERO     Full                                                         +---------+---------------+---------+-----------+----------+--------------+   +---------+---------------+---------+-----------+----------+--------------+ LEFT     CompressibilityPhasicitySpontaneityPropertiesThrombus Aging +---------+---------------+---------+-----------+----------+--------------+ CFV      Full           Yes      Yes                                 +---------+---------------+---------+-----------+----------+--------------+ SFJ      Full                                                        +---------+---------------+---------+-----------+----------+--------------+ FV Prox  Full                                                        +---------+---------------+---------+-----------+----------+--------------+  FV Mid   Full                                                        +---------+---------------+---------+-----------+----------+--------------+ FV DistalFull                                                        +---------+---------------+---------+-----------+----------+--------------+ PFV      Full                                                        +---------+---------------+---------+-----------+----------+--------------+ POP      Full           Yes      Yes                                 +---------+---------------+---------+-----------+----------+--------------+ PTV      Full                                                        +---------+---------------+---------+-----------+----------+--------------+ PERO     Full                                                        +---------+---------------+---------+-----------+----------+--------------+    Summary: RIGHT: - There is no evidence of deep vein thrombosis in the lower extremity.  - No cystic structure found in the popliteal fossa.  LEFT: - There is no evidence of deep vein thrombosis in the lower extremity.  - No cystic structure found in  the popliteal fossa.  *See table(s) above for measurements and observations.    Preliminary    CT ABDOMEN PELVIS W CONTRAST Result Date: 06/04/2024 CLINICAL DATA:  Metastatic melanoma.  * Tracking Code: BO * EXAM: CT ABDOMEN AND PELVIS WITH CONTRAST TECHNIQUE: Multidetector CT imaging of the abdomen and pelvis was performed using the standard protocol following bolus administration of intravenous contrast. RADIATION DOSE REDUCTION: This exam was performed according to the departmental dose-optimization program which includes automated exposure control, adjustment of the mA and/or kV according to patient size and/or use of iterative reconstruction technique. CONTRAST:  80mL OMNIPAQUE  IOHEXOL  300 MG/ML  SOLN COMPARISON:  Chest CT 06/04/2024.  PET-CT 12/15/2021. FINDINGS: Lower chest: Atherosclerosis of the aorta and coronary arteries. Calcifications of the aortic valve and mitral annulus. As seen on earlier chest CT, there are small left greater than right pleural effusions without atelectasis or scarring at both lung bases. Hepatobiliary: There are multiple hypoattenuating masses throughout the liver, consistent with widespread metastatic disease. Largest lesion is located inferiorly in the right lobe, measuring 1.5 cm on image  27/3. Possible gallbladder wall thickening without evidence of gallstones or significant biliary dilatation. Pancreas: Pancreatic atrophy without intrinsic mass lesion, ductal dilatation or surrounding inflammation. Spleen: Normal in size without focal abnormality. Adrenals/Urinary Tract: The adrenal glands are largely obscured by numerous surrounding retroperitoneal nodules. 2.5 x 1.1 cm nodule involving the lateral limb of the left adrenal gland on image 21/3 appears similar in size to the previous PET-CT, possibly an incidental adenoma. No evidence of urinary tract calculus, suspicious renal lesion or hydronephrosis. The bladder appears unremarkable for its degree of distention.  Stomach/Bowel: No enteric contrast administered. The stomach appears unremarkable for its degree of distension. No evidence of bowel wall thickening, distention or surrounding inflammatory change. Small bowel extends into a right inguinal hernia and the sigmoid colon extends into a left inguinal hernia. No evidence of incarceration or obstruction. Vascular/Lymphatic: As peripherally image done earlier chest CT, there is new widespread nodularity throughout the lower mediastinum, mesentery and retroperitoneal consistent with widespread metastatic disease. Some of this may be nodal, specially a 2.2 x 2.2 cm left periaortic node on image 19/3. Aortic and branch vessel atherosclerosis without evidence of aneurysm or large vessel occlusion. Reproductive: Uterus and ovaries appear unremarkable. No adnexal mass. Other: As above, widespread metastatic disease with diffuse nodularity throughout the lower mediastinum, retroperitoneum and mesentery. There is omental caking in the left upper quadrant, measuring up to 12.5 x 4.7 cm on image 26/3. There is a moderate amount of pelvic ascites which extends into both inguinal hernias mentioned above. Musculoskeletal: Multiple subcutaneous nodules are not seen on the previous PET-CT, suspicious for metastatic disease. There is generalized muscular atrophy. Widespread nodular increased density throughout the bone marrow, most notably within the sacrum, consistent with widespread osseous metastatic disease. Associated presacral soft tissue nodule measuring 2.4 cm on image 49/3. Grossly stable chronic superior endplate compression fracture at L2. IMPRESSION: 1. Widespread metastatic disease throughout the lower mediastinum, retroperitoneum, mesentery and subcutaneous tissues. 2. Widespread hepatic and osseous metastatic disease. 3. Moderate amount of pelvic ascites which extends into both inguinal hernias. 4. No evidence of bowel obstruction or perforation. 5.  Aortic Atherosclerosis  (ICD10-I70.0). Electronically Signed   By: Elsie Perone M.D.   On: 06/04/2024 16:06   CT Chest Wo Contrast Result Date: 06/04/2024 CLINICAL DATA:  Malignant melanoma. Short of breath. Abnormal chest radiograph. * Tracking Code: BO * EXAM: CT CHEST WITHOUT CONTRAST TECHNIQUE: Multidetector CT imaging of the chest was performed following the standard protocol without IV contrast. RADIATION DOSE REDUCTION: This exam was performed according to the departmental dose-optimization program which includes automated exposure control, adjustment of the mA and/or kV according to patient size and/or use of iterative reconstruction technique. COMPARISON:  FDG PET scan 12/15/2021 FINDINGS: Cardiovascular: Coronary artery calcification and aortic atherosclerotic calcification. No pericardial effusion Mediastinum/Nodes: Round lymph node in the anterior mediastinum measuring 11 mm on image 67. No supraclavicular adenopathy. Rounded RIGHT sub pectoralis node measures 10 mm on image 41. Lungs/Pleura: Lytic lesion of the posterior lateral RIGHT fifth rib with soft tissue expansion the pleural space. This subpleural expansion measures 21 by 34 mm on image 38/2. Upper Abdomen: Extensive nodularity within the gastrosplenic ligament (image 108). Extensive nodularity in the pararenal spaces. For example nodule in the superior RIGHT perirenal space measuring 15 mm on image 89/2. Nodularity in the LEFT and RIGHT pericardial fat anterior to the liver and LEFT hemidiaphragm. Musculoskeletal: Multiple healed posterior RIGHT rib fractures. Malignant expansion of the RIGHT lateral fifth rib is described in the chest  section. Similar lytic destruction of the posterior LEFT eighth rib measuring 3.1 by 4.1 cm on image 80. IMPRESSION: 1. Extensive nodularity in the upper abdomen consistent with peritoneal and retroperitoneal metastasis. 2. Malignant lytic destruction of the RIGHT fifth and LEFT eighth ribs with soft tissue expansion into the  pleural space. 3. Enlarged anterior mediastinal lymph node and RIGHT sub pectoralis lymph node. 4.  Aortic Atherosclerosis (ICD10-I70.0). Electronically Signed   By: Jackquline Boxer M.D.   On: 06/04/2024 13:07   DG Chest 1 View Result Date: 06/04/2024 CLINICAL DATA:  Shortness of breath. EXAM: CHEST  1 VIEW COMPARISON:  10/20/2021 FINDINGS: Cardiopericardial silhouette is at upper limits of normal for size. Left base atelectasis or infiltrate with small left effusion. Tiny right pleural effusion evident. No pulmonary edema or pneumothorax. Focal masslike pleural thickening identified lateral upper right hemithorax, new in the interval with potential destruction of the posterolateral right fifth rib. Telemetry leads overlie the chest. IMPRESSION: 1. New focal masslike pleural thickening lateral upper right hemithorax with potential destruction of the posterolateral right fifth rib. CT chest with contrast recommended to further evaluate. 2. Left base atelectasis or infiltrate with small left and tiny right pleural effusions. Electronically Signed   By: Camellia Candle M.D.   On: 06/04/2024 10:49      Vernal Alstrom, MD  [facility] 06/05/2024  If 7PM-7AM, please contact night-coverage

## 2024-06-05 NOTE — Progress Notes (Signed)
 Bilateral lower extremity venous duplex has been completed. Preliminary results can be found in CV Proc through chart review.   06/05/24 11:42 AM Cathlyn Collet RVT

## 2024-06-06 DIAGNOSIS — E871 Hypo-osmolality and hyponatremia: Secondary | ICD-10-CM | POA: Diagnosis not present

## 2024-06-06 DIAGNOSIS — Z7189 Other specified counseling: Secondary | ICD-10-CM

## 2024-06-06 LAB — BASIC METABOLIC PANEL WITH GFR
Anion gap: 15 (ref 5–15)
BUN: 14 mg/dL (ref 8–23)
CO2: 20 mmol/L — ABNORMAL LOW (ref 22–32)
Calcium: 9.3 mg/dL (ref 8.9–10.3)
Chloride: 94 mmol/L — ABNORMAL LOW (ref 98–111)
Creatinine, Ser: 0.38 mg/dL — ABNORMAL LOW (ref 0.44–1.00)
GFR, Estimated: >60 mL/min (ref >60)
Glucose, Bld: 87 mg/dL (ref 70–99)
Potassium: 3.5 mmol/L (ref 3.5–5.1)
Sodium: 129 mmol/L — ABNORMAL LOW (ref 135–145)

## 2024-06-06 LAB — CBC
HCT: 32.3 % — ABNORMAL LOW (ref 36.0–46.0)
Hemoglobin: 10.4 g/dL — ABNORMAL LOW (ref 12.0–15.0)
MCH: 29.4 pg (ref 26.0–34.0)
MCHC: 32.2 g/dL (ref 30.0–36.0)
MCV: 91.2 fL (ref 80.0–100.0)
Platelets: 300 K/uL (ref 150–400)
RBC: 3.54 MIL/uL — ABNORMAL LOW (ref 3.87–5.11)
RDW: 14 % (ref 11.5–15.5)
WBC: 12 K/uL — ABNORMAL HIGH (ref 4.0–10.5)
nRBC: 0 % (ref 0.0–0.2)

## 2024-06-06 LAB — URINE CULTURE

## 2024-06-06 MED ORDER — SENNOSIDES-DOCUSATE SODIUM 8.6-50 MG PO TABS: 1.0000 | ORAL_TABLET | Freq: Every evening | ORAL | 0 refills | Status: AC | PRN

## 2024-06-06 MED ORDER — ONDANSETRON HCL 4 MG PO TABS: 4.0000 mg | ORAL_TABLET | Freq: Four times a day (QID) | ORAL | 0 refills | Status: AC | PRN

## 2024-06-06 MED ORDER — ENSURE PLUS HIGH PROTEIN PO LIQD: 237.0000 mL | Freq: Two times a day (BID) | ORAL | Status: AC

## 2024-06-06 NOTE — Discharge Summary (Signed)
 Physician Discharge Summary  Alyssabeth Bruster FMW:[id number] DOB: [birth year] DOA: 06/04/2024  PCP: Sherlynn Madden, MD  Admit date: 06/04/2024 Discharge date: 06/06/2024  Admitted From: Home  Discharge disposition: Home with hospice   Recommendations for Outpatient Follow-Up:   Follow up with a hospice care provider at home.  Discharge Diagnosis:   Principal Problem:   Hyponatremia Active Problems:   Metastatic disease (HCC)   Discharge Condition: Improved.  Diet recommendation:   Regular.  Wound care: None.  Code status: DNR   History of Present Illness:   Christina Mcguire is an 83 y.o. 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  Patient also had shortness of breath with activity and some lower extremity edema.  In the ED patient was afebrile, saturating low 90s in room air with tachycardia and blood pressure stable.  Initial labs showed sodium low at 123, WBC slightly elevated at 12.4.  Troponin was 34 followed by 33.  CT scan of the chest was concerning for metastatic disease throughout the lower mediastinum, retroperitoneum, mesentery, subcutaneous tissues, and widespread hepatic and osseous metastasis with pathologic rib fractures.  Patient was then considered for admission to the hospital for further evaluation and treatment.    Hospital Course:   Following conditions were addressed during hospitalization as listed below,  Hyponatremia  Had sodium of 123 on presentation with peripheral edema.  Sodium today at 129.  Urinary sodium less than 30.  Serum osmolality low at 271.  Possibility of SIADH.  Currently on fluid  restriction 1200 mL/day.  Discussed about limitations of t fluid to 1500/day.  Will regularize diet on discharge.   Metastatic disease  CT scan with widespread metastatic disease.  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  Patient does not wish to proceed with further investigation at this time.  I again had a long conversation with the patient as well as patient's daughter at bedside during hospitalization..  Palliative was consulted and plan for hospice at home on discharge.   Mild shortness of breath.  Improved.  On room air at this time.   Irregular rhythm.  EKG showed sinus tachycardia.  Improved at this time.   Failure to thrive weight loss poor oral intake likely secondary to metastatic cancer.  Supportive care.  Recommend protein supplements on discharge.   Abnormal urinalysis.  No urinary symptoms.  Likely asymptomatic bacteriuria.  No indication for antibiotic.   Disposition.  At this time, patient is stable for disposition home with hospice.  Medical Consultants:   Palliative care  Procedures:    None Subjective:   Today, patient was seen and examined at bedside.  Feels better.  Sitting up in the chair.  No nausea vomiting fever chills or rigor.  No dyspnea or chest pain.  Discharge Exam:   Vitals:   06/05/24 2001 06/06/24 0538  BP: 108/64 116/64  Pulse: 96 97  Resp: 18 16  Temp: 98 F (36.7 C) 98.3 F (36.8 C)  SpO2: 94% 95%   Vitals:   06/05/24 1328 06/05/24 1800 06/05/24 2001 06/06/24 0538  BP: 118/65 114/73 108/64 116/64  Pulse: (!) 106 (!) 105 96 97  Resp: 20 18 18 16   Temp:  97.8 F (36.6 C) (!) 97.5 F (36.4 C) 98 F (36.7 C) 98.3 F (36.8 C)  TempSrc:      SpO2: 93% 93% 94% 95%  Weight:      Height:       Body mass index is 23.59 kg/m.  General: Alert awake, not in obvious distress, elderly female, Communicative, HENT: pupils equally reacting to light,  No scleral pallor or icterus noted. Oral mucosa is moist.  Chest: Decreased breath sounds  bilaterally. CVS: S1 &S2 heard. No murmur.  Regular rate and rhythm. Abdomen: Soft, nontender, nondistended.  Bowel sounds are heard.   Extremities: No cyanosis, clubbing or edema.  Peripheral pulses are palpable. Psych: Alert, awake and oriented, normal mood CNS:  No cranial nerve deficits.  Moves all extremities. Skin: Warm and dry.  No rashes noted.  The results of significant diagnostics from this hospitalization (including imaging, microbiology, ancillary and laboratory) are listed below for reference.     Diagnostic Studies:   VAS US  LOWER EXTREMITY VENOUS (DVT) Result Date: 06/05/2024  Lower Venous DVT Study Patient Name:  Christina Mcguire  Date of Exam:   06/05/2024 Medical Rec #: [id number]    Accession #:    [id number] Date of Birth: 17-Nov-1940     Patient Gender: F Patient Age:   66 years Exam Location:  [facility] Procedure:      VAS US  LOWER EXTREMITY VENOUS (DVT) Referring Phys: TIMOTHY OPYD --------------------------------------------------------------------------------  Indications: Edema.  Risk Factors: None identified. Limitations: Poor ultrasound/tissue interface. Comparison Study: No prior studies. Performing Technologist: Cordella Collet RVT  Examination Guidelines: A complete evaluation includes B-mode imaging, spectral Doppler, color Doppler, and power Doppler as needed of all accessible portions of each vessel. Bilateral testing is considered an integral part of a complete examination. Limited examinations for reoccurring indications may be performed as noted. The reflux portion of the exam is performed with the patient in reverse Trendelenburg.  +---------+---------------+---------+-----------+----------+--------------+ RIGHT    CompressibilityPhasicitySpontaneityPropertiesThrombus Aging +---------+---------------+---------+-----------+----------+--------------+ CFV      Full           Yes      Yes                                  +---------+---------------+---------+-----------+----------+--------------+ SFJ      Full                                                        +---------+---------------+---------+-----------+----------+--------------+ FV Prox  Full                                                        +---------+---------------+---------+-----------+----------+--------------+ FV Mid   Full                                                        +---------+---------------+---------+-----------+----------+--------------+ FV DistalFull                                                        +---------+---------------+---------+-----------+----------+--------------+  PFV      Full                                                        +---------+---------------+---------+-----------+----------+--------------+ POP      Full           Yes      Yes                                 +---------+---------------+---------+-----------+----------+--------------+ PTV      Full                                                        +---------+---------------+---------+-----------+----------+--------------+ PERO     Full                                                        +---------+---------------+---------+-----------+----------+--------------+   +---------+---------------+---------+-----------+----------+--------------+ LEFT     CompressibilityPhasicitySpontaneityPropertiesThrombus Aging +---------+---------------+---------+-----------+----------+--------------+ CFV      Full           Yes      Yes                                 +---------+---------------+---------+-----------+----------+--------------+ SFJ      Full                                                        +---------+---------------+---------+-----------+----------+--------------+ FV Prox  Full                                                         +---------+---------------+---------+-----------+----------+--------------+ FV Mid   Full                                                        +---------+---------------+---------+-----------+----------+--------------+ FV DistalFull                                                        +---------+---------------+---------+-----------+----------+--------------+ PFV      Full                                                        +---------+---------------+---------+-----------+----------+--------------+  POP      Full           Yes      Yes                                 +---------+---------------+---------+-----------+----------+--------------+ PTV      Full                                                        +---------+---------------+---------+-----------+----------+--------------+ PERO     Full                                                        +---------+---------------+---------+-----------+----------+--------------+     Summary: RIGHT: - There is no evidence of deep vein thrombosis in the lower extremity.  - No cystic structure found in the popliteal fossa.  LEFT: - There is no evidence of deep vein thrombosis in the lower extremity.  - No cystic structure found in the popliteal fossa.  *See table(s) above for measurements and observations. Electronically signed by Fonda Rim on 06/05/2024 at 2:51:01 PM.    Final    CT ABDOMEN PELVIS W CONTRAST Result Date: 06/04/2024 CLINICAL DATA:  Metastatic melanoma.  * Tracking Code: BO * EXAM: CT ABDOMEN AND PELVIS WITH CONTRAST TECHNIQUE: Multidetector CT imaging of the abdomen and pelvis was performed using the standard protocol following bolus administration of intravenous contrast. RADIATION DOSE REDUCTION: This exam was performed according to the departmental dose-optimization program which includes automated exposure control, adjustment of the mA and/or kV according to patient size and/or use of iterative  reconstruction technique. CONTRAST:  80mL OMNIPAQUE  IOHEXOL  300 MG/ML  SOLN COMPARISON:  Chest CT 06/04/2024.  PET-CT 12/15/2021. FINDINGS: Lower chest: Atherosclerosis of the aorta and coronary arteries. Calcifications of the aortic valve and mitral annulus. As seen on earlier chest CT, there are small left greater than right pleural effusions without atelectasis or scarring at both lung bases. Hepatobiliary: There are multiple hypoattenuating masses throughout the liver, consistent with widespread metastatic disease. Largest lesion is located inferiorly in the right lobe, measuring 1.5 cm on image 27/3. Possible gallbladder wall thickening without evidence of gallstones or significant biliary dilatation. Pancreas: Pancreatic atrophy without intrinsic mass lesion, ductal dilatation or surrounding inflammation. Spleen: Normal in size without focal abnormality. Adrenals/Urinary Tract: The adrenal glands are largely obscured by numerous surrounding retroperitoneal nodules. 2.5 x 1.1 cm nodule involving the lateral limb of the left adrenal gland on image 21/3 appears similar in size to the previous PET-CT, possibly an incidental adenoma. No evidence of urinary tract calculus, suspicious renal lesion or hydronephrosis. The bladder appears unremarkable for its degree of distention. Stomach/Bowel: No enteric contrast administered. The stomach appears unremarkable for its degree of distension. No evidence of bowel wall thickening, distention or surrounding inflammatory change. Small bowel extends into a right inguinal hernia and the sigmoid colon extends into a left inguinal hernia. No evidence of incarceration or obstruction. Vascular/Lymphatic: As peripherally image done earlier chest CT, there is new widespread nodularity throughout the lower mediastinum, mesentery and retroperitoneal consistent with widespread metastatic disease. Some of this  may be nodal, specially a 2.2 x 2.2 cm left periaortic node on image 19/3.  Aortic and branch vessel atherosclerosis without evidence of aneurysm or large vessel occlusion. Reproductive: Uterus and ovaries appear unremarkable. No adnexal mass. Other: As above, widespread metastatic disease with diffuse nodularity throughout the lower mediastinum, retroperitoneum and mesentery. There is omental caking in the left upper quadrant, measuring up to 12.5 x 4.7 cm on image 26/3. There is a moderate amount of pelvic ascites which extends into both inguinal hernias mentioned above. Musculoskeletal: Multiple subcutaneous nodules are not seen on the previous PET-CT, suspicious for metastatic disease. There is generalized muscular atrophy. Widespread nodular increased density throughout the bone marrow, most notably within the sacrum, consistent with widespread osseous metastatic disease. Associated presacral soft tissue nodule measuring 2.4 cm on image 49/3. Grossly stable chronic superior endplate compression fracture at L2. IMPRESSION: 1. Widespread metastatic disease throughout the lower mediastinum, retroperitoneum, mesentery and subcutaneous tissues. 2. Widespread hepatic and osseous metastatic disease. 3. Moderate amount of pelvic ascites which extends into both inguinal hernias. 4. No evidence of bowel obstruction or perforation. 5.  Aortic Atherosclerosis (ICD10-I70.0). Electronically Signed   By: Elsie Perone M.D.   On: 06/04/2024 16:06   CT Chest Wo Contrast Result Date: 06/04/2024 CLINICAL DATA:  Malignant melanoma. Short of breath. Abnormal chest radiograph. * Tracking Code: BO * EXAM: CT CHEST WITHOUT CONTRAST TECHNIQUE: Multidetector CT imaging of the chest was performed following the standard protocol without IV contrast. RADIATION DOSE REDUCTION: This exam was performed according to the departmental dose-optimization program which includes automated exposure control, adjustment of the mA and/or kV according to patient size and/or use of iterative reconstruction technique.  COMPARISON:  FDG PET scan 12/15/2021 FINDINGS: Cardiovascular: Coronary artery calcification and aortic atherosclerotic calcification. No pericardial effusion Mediastinum/Nodes: Round lymph node in the anterior mediastinum measuring 11 mm on image 67. No supraclavicular adenopathy. Rounded RIGHT sub pectoralis node measures 10 mm on image 41. Lungs/Pleura: Lytic lesion of the posterior lateral RIGHT fifth rib with soft tissue expansion the pleural space. This subpleural expansion measures 21 by 34 mm on image 38/2. Upper Abdomen: Extensive nodularity within the gastrosplenic ligament (image 108). Extensive nodularity in the pararenal spaces. For example nodule in the superior RIGHT perirenal space measuring 15 mm on image 89/2. Nodularity in the LEFT and RIGHT pericardial fat anterior to the liver and LEFT hemidiaphragm. Musculoskeletal: Multiple healed posterior RIGHT rib fractures. Malignant expansion of the RIGHT lateral fifth rib is described in the chest section. Similar lytic destruction of the posterior LEFT eighth rib measuring 3.1 by 4.1 cm on image 80. IMPRESSION: 1. Extensive nodularity in the upper abdomen consistent with peritoneal and retroperitoneal metastasis. 2. Malignant lytic destruction of the RIGHT fifth and LEFT eighth ribs with soft tissue expansion into the pleural space. 3. Enlarged anterior mediastinal lymph node and RIGHT sub pectoralis lymph node. 4.  Aortic Atherosclerosis (ICD10-I70.0). Electronically Signed   By: Jackquline Boxer M.D.   On: 06/04/2024 13:07   DG Chest 1 View Result Date: 06/04/2024 CLINICAL DATA:  Shortness of breath. EXAM: CHEST  1 VIEW COMPARISON:  10/20/2021 FINDINGS: Cardiopericardial silhouette is at upper limits of normal for size. Left base atelectasis or infiltrate with small left effusion. Tiny right pleural effusion evident. No pulmonary edema or pneumothorax. Focal masslike pleural thickening identified lateral upper right hemithorax, new in the interval  with potential destruction of the posterolateral right fifth rib. Telemetry leads overlie the chest. IMPRESSION: 1. New focal masslike pleural thickening  lateral upper right hemithorax with potential destruction of the posterolateral right fifth rib. CT chest with contrast recommended to further evaluate. 2. Left base atelectasis or infiltrate with small left and tiny right pleural effusions. Electronically Signed   By: Camellia Candle M.D.   On: 06/04/2024 10:49     Labs:   Basic Metabolic Panel: Recent Labs  Lab 06/04/24 1054 06/04/24 2135 06/05/24 0143 06/05/24 0822 06/06/24 0506  NA 123* 125* 125* 128* 129*  K 3.6  --   --  3.9 3.5  CL 87*  --   --  91* 94*  CO2 18*  --   --  20* 20*  GLUCOSE 95  --   --  107* 87  BUN 15  --   --  13 14  CREATININE 0.46  --   --  0.46 0.38*  CALCIUM 9.4  --   --  9.6 9.3  MG  --   --   --  1.9  --    GFR Estimated Creatinine Clearance: 41.4 mL/min (A) (by C-G formula based on SCr of 0.38 mg/dL (L)). Liver Function Tests: Recent Labs  Lab 06/04/24 1054  AST 37  ALT 8  ALKPHOS 150*  BILITOT 0.9  PROT 5.4*  ALBUMIN 3.0*   Recent Labs  Lab 06/04/24 1054  LIPASE 28   No results for input(s): AMMONIA in the last 168 hours. Coagulation profile No results for input(s): INR, PROTIME in the last 168 hours.  CBC: Recent Labs  Lab 06/04/24 1054 06/05/24 0137 06/06/24 0506  WBC 12.4* 12.9* 12.0*  NEUTROABS 9.4*  --   --   HGB 11.3* 10.6* 10.4*  HCT 33.6* 33.0* 32.3*  MCV 89.8 93.0 91.2  PLT 320 345 300   Cardiac Enzymes: No results for input(s): CKTOTAL, CKMB, CKMBINDEX, TROPONINI in the last 168 hours. BNP: Invalid input(s): POCBNP CBG: No results for input(s): GLUCAP in the last 168 hours. D-Dimer No results for input(s): DDIMER in the last 72 hours. Hgb A1c No results for input(s): HGBA1C in the last 72 hours. Lipid Profile No results for input(s): CHOL, HDL, LDLCALC, TRIG, CHOLHDL,  LDLDIRECT in the last 72 hours. Thyroid  function studies No results for input(s): TSH, T4TOTAL, T3FREE, THYROIDAB in the last 72 hours.  Invalid input(s): FREET3 Anemia work up No results for input(s): VITAMINB12, FOLATE, FERRITIN, TIBC, IRON, RETICCTPCT in the last 72 hours. Microbiology No results found for this or any previous visit (from the past 240 hours).   Discharge Instructions:   Discharge Instructions     Diet general   Complete by: As directed    Discharge instructions   Complete by: As directed    Follow up with hospice care provider after discharge.   Increase activity slowly   Complete by: As directed    No wound care   Complete by: As directed       Allergies as of 06/06/2024   No Known Allergies      Medication List     TAKE these medications    ADULT GUMMY PO Take 2 capsules by mouth daily.   ascorbic acid 500 MG tablet Commonly known as: VITAMIN C Take 500 mg by mouth daily.   COVID-19 mRNA vaccine syringe Commonly known as: SPIKEVAX Inject 0.5 mLs into the muscle as directed.   feeding supplement Liqd Take 237 mLs by mouth 2 (two) times daily between meals.   ondansetron  4 MG tablet Commonly known as: ZOFRAN  Take 1 tablet (4 mg total)  by mouth every 6 (six) hours as needed for nausea.   Prevagen 10 MG Caps Generic drug: Apoaequorin Take 10 mg by mouth daily.   senna-docusate 8.6-50 MG tablet Commonly known as: Senokot-S Take 1 tablet by mouth at bedtime as needed for mild constipation.   VITAMIN D  PO Take 1 capsule by mouth daily.        Follow-up Information     [facility] Follow up.   Specialty: Hospice and Palliative Medicine Why: home nurse visit. Contact information: [address] [facility] [place]  [address] [phone]                 Time coordinating discharge: 39 minutes  Signed:  Evadne Ose  [facility] 06/06/2024, 11:02 AM

## 2024-06-06 NOTE — TOC Transition Note (Signed)
 Transition of Care [facility]) - Discharge Note   Patient Details  Name: Christina Mcguire MRN: [id number] Date of Birth: May 07, 1941  [facility] of [facility]) CM/SW Contact:  Bascom Service, RN Phone Number: 06/06/2024, 10:19 AM   Clinical Narrative: d/c home w/hospice today w/[facility] services. No DME needed per Lynn(dtr) currently. DNR to be placed in packet.Has own transport.No further CM needs.      Final next level of care: Home w Hospice Care Barriers to Discharge: No Barriers Identified   Patient Goals and CMS Choice Patient states their goals for this hospitalization and ongoing recovery are:: Home CMS Medicare.gov Compare Post Acute Care list provided to:: Patient Represenative (must comment) (Lynn(dtr)) Choice offered to / list presented to : Adult Children [place] ownership interest in [facility].provided to:: Adult Children    Discharge Placement                       Discharge Plan and Services Additional resources added to the After Visit Summary for     Discharge Planning Services: CM Consult Post Acute Care Choice: Hospice                    HH Arranged: RN [facility] Agency: [facility] Date [facility] Agency Contacted: 06/06/24 Time HH Agency Contacted: 1012 Representative spoke with at [facility] Agency: [facility])  Social Drivers of Health (SDOH) Interventions SDOH Screenings   Food Insecurity: No Food Insecurity (06/04/2024)  Housing: Low Risk  (06/04/2024)  Transportation Needs: No Transportation Needs (06/04/2024)  Utilities: Not At Risk (06/04/2024)  Alcohol Screen: Low Risk  (05/27/2024)  Depression (PHQ2-9): Low Risk  (05/28/2024)  Financial Resource Strain: Low Risk  (05/27/2024)  Physical Activity: Inactive (05/27/2024)  Social Connections: Moderately Integrated (06/04/2024)  Stress: No Stress Concern Present (05/27/2024)  Tobacco Use: Low Risk  (06/04/2024)     Readmission Risk Interventions     No data to display

## 2024-06-06 NOTE — Progress Notes (Signed)
 Palliative Medicine Inpatient Follow Up Note   HPI: 83 y.o. 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   PMT has been consulted to assist with goals of care conversation. Patient/Family face treatment option decisions, advanced directive decisions and anticipatory care needs.   PMT saw her yesterday for treatment option and goals of care discussion. We emphasized the importance of providing and receiving care and treatments that aligns with patient's wishes, goals and preferences. Patient opted to pursue comfort-focused care, with home with hospice at discharge with [facility].    Today's Discussion 06/06/2024  *Please note that this is a verbal dictation therefore any spelling or grammatical errors are due to the Dragon Medical One system interpretation.  Chart reviewed inclusive of vital signs, progress notes, laboratory results, and diagnostic images.   Created space and opportunity for patient to explore thoughts feelings and fears regarding current medical situation.  I visited the patient at her bedside today, accompanied by her daughter. The patient appears frail and fatigued but is not exhibiting any signs of acute distress. She was seated comfortably in a recliner, breathing spontaneously on room air. According to the patient, she is feeling well overall and is not experiencing any pain or discomfort, though she continues to feel short of breath with minimal  exertion.  Her appetite remains poor, she reports not feeling hungry but makes an effort to stay hydrated, drinking approximately five glasses of water daily. She shared that she slept well last night and remains hopeful about her getting better.   Both the patient and her daughter expressed eagerness to return home today. Outpatient Hospice has already initiated contact to coordinate logistics, including durable medical equipment needs and scheduling the initial RN visit upon discharge. They both expressed appreciation for the care and support received during the hospitalization.  Provided daughter Macario with [employer] booklet.   Questions and concerns addressed   Palliative Support Provided.   Objective Assessment: Vital Signs Vitals:   06/05/24 2001 06/06/24 0538  BP: 108/64 116/64  Pulse: 96 97  Resp: 18 16  Temp: 98 F (36.7 C) 98.3 F (36.8 C)  SpO2: 94% 95%    Intake/Output Summary (Last 24 hours) at 06/06/2024 0952 Last data filed at 06/05/2024 2133 Gross per 24 hour  Intake 103 ml  Output --  Net 103 ml   Last Weight  Most recent update: 06/04/2024  9:56 AM    Weight  54.8 kg (120 lb 13 oz)             Physical Exam Vitals and nursing note reviewed.  Constitutional:      Appearance: She is chronically ill-appearing.  HENT:     Head: Normocephalic and atraumatic.  Cardiovascular:     Rate and Rhythm: Normal rate.  Pulmonary:     Effort: Pulmonary effort is normal.  Abdominal:     Palpations: Abdomen is soft.  Musculoskeletal:     Comments: Generalized weakness.   Skin:    General: Skin is warm.  Neurological:  General: No focal deficit present.     Mental Status: She is alert and oriented to person, place, and time.     SUMMARY OF RECOMMENDATIONS   Code Status: Maintain DNR-Comfort per patient's wish/preference. DNR form completed and placed on file. Patient opted for comfort-focused care, no aggressive medical  interventions or escalation in level of care. Patient to be discharged today with home hospice with [facility]. Family already in coordination with Hospice agency.  Continue to provide psycho-social and emotional support to patient and family Palliative medicine team will continue to follow.    Symptom Management: Per Primary team Palliative medicine is available to assist as needed.    Discussed treatment plan with patient, daughter, nursing and the treatment team.   Time Spent: I spent 35 minutes in the care of the patient today and documenting the encounter.   __________________________________________________________________________ Kathlyne Bolder NP-C [facility] Palliative Medicine Team Team Cell Phone: [phone] Please utilize secure chat with additional questions, if there is no response within 30 minutes please call the above phone number  Palliative Medicine Team providers are available by phone from 7am to 7pm daily and can be reached through the team cell phone.  Should this patient require assistance outside of these hours, please call the patient's attending physician.

## 2024-06-12 ENCOUNTER — Telehealth: Payer: Self-pay

## 2024-06-12 NOTE — Telephone Encounter (Addendum)
 Noted, mychart message sent as another form of communication

## 2024-06-12 NOTE — Telephone Encounter (Signed)
 Attempted to contact the patient's daughter but was unable to reach her. A detailed voicemail was left requesting a return call to schedule an appointment for form completion per Dr. Sherlynn. Forms are located in the cabinet at the front desk.

## 2024-06-12 NOTE — Telephone Encounter (Signed)
 Copied from CRM #[id number]. Topic: General - Other >> Jun 12, 2024 11:54 AM Miquel SAILOR wrote: Reason for CRM: patient daughter Macario called to let office know due to Ptgoing on Hospice she will no longer be a Pt with Dr. Sherlynn. Any questions call  [phone]

## 2024-06-26 ENCOUNTER — Other Ambulatory Visit (HOSPITAL_BASED_OUTPATIENT_CLINIC_OR_DEPARTMENT_OTHER)

## 2024-07-17 ENCOUNTER — Telehealth: Payer: Self-pay

## 2024-07-17 NOTE — Telephone Encounter (Signed)
 Copied from CRM [id number]. Topic: General - Other >> Jul 17, 2024 11:49 AM Chiquita SQUIBB wrote: Reason for CRM: Patients daughter called in to cancel the patients upcoming appointment due to her passing on 10/18. The appointment has been cancelled, wanted to notify the office as well.

## 2024-07-20 DEATH — deceased

## 2024-09-27 ENCOUNTER — Ambulatory Visit: Payer: Self-pay | Admitting: Nurse Practitioner

## 2024-10-15 ENCOUNTER — Other Ambulatory Visit
